# Patient Record
Sex: Male | Born: 1960 | Race: White | Hispanic: Yes | Marital: Single | Smoking: Current every day smoker
Health system: Southern US, Community
[De-identification: ages and names within clinical notes are randomized; demographics above are authoritative.]

## PROBLEM LIST (undated history)

## (undated) DIAGNOSIS — E119 Type 2 diabetes mellitus without complications: Secondary | ICD-10-CM

## (undated) HISTORY — PX: TOE AMPUTATION: SHX809

---

## 2008-03-28 ENCOUNTER — Inpatient Hospital Stay: Payer: Self-pay | Admitting: Surgery

## 2021-04-16 ENCOUNTER — Emergency Department: Payer: Self-pay

## 2021-04-16 ENCOUNTER — Encounter: Payer: Self-pay | Admitting: Emergency Medicine

## 2021-04-16 ENCOUNTER — Other Ambulatory Visit: Payer: Self-pay

## 2021-04-16 ENCOUNTER — Observation Stay: Payer: Self-pay | Admitting: Certified Registered"

## 2021-04-16 ENCOUNTER — Observation Stay
Admission: EM | Admit: 2021-04-16 | Discharge: 2021-04-18 | Disposition: A | Discharge status: Home / Self Care | Payer: Self-pay | Attending: Family Medicine | Admitting: Family Medicine

## 2021-04-16 ENCOUNTER — Observation Stay: Payer: Self-pay

## 2021-04-16 ENCOUNTER — Encounter
Admission: EM | Disposition: A | Discharge status: Home / Self Care | Payer: Self-pay | Source: Home / Self Care | Attending: Emergency Medicine

## 2021-04-16 DIAGNOSIS — L02519 Cutaneous abscess of unspecified hand: Secondary | ICD-10-CM | POA: Diagnosis present

## 2021-04-16 DIAGNOSIS — F17210 Nicotine dependence, cigarettes, uncomplicated: Secondary | ICD-10-CM | POA: Insufficient documentation

## 2021-04-16 DIAGNOSIS — Z419 Encounter for procedure for purposes other than remedying health state, unspecified: Secondary | ICD-10-CM

## 2021-04-16 DIAGNOSIS — L03119 Cellulitis of unspecified part of limb: Secondary | ICD-10-CM | POA: Diagnosis present

## 2021-04-16 DIAGNOSIS — D72829 Elevated white blood cell count, unspecified: Secondary | ICD-10-CM | POA: Insufficient documentation

## 2021-04-16 DIAGNOSIS — R739 Hyperglycemia, unspecified: Secondary | ICD-10-CM | POA: Diagnosis present

## 2021-04-16 DIAGNOSIS — Z20822 Contact with and (suspected) exposure to covid-19: Secondary | ICD-10-CM | POA: Insufficient documentation

## 2021-04-16 DIAGNOSIS — L03114 Cellulitis of left upper limb: Principal | ICD-10-CM | POA: Insufficient documentation

## 2021-04-16 DIAGNOSIS — E1165 Type 2 diabetes mellitus with hyperglycemia: Secondary | ICD-10-CM | POA: Insufficient documentation

## 2021-04-16 DIAGNOSIS — Z23 Encounter for immunization: Secondary | ICD-10-CM | POA: Insufficient documentation

## 2021-04-16 HISTORY — DX: Type 2 diabetes mellitus without complications: E11.9

## 2021-04-16 HISTORY — PX: I & D EXTREMITY: SHX5045

## 2021-04-16 LAB — CBC WITH DIFFERENTIAL/PLATELET
Abs Immature Granulocytes: 0.14 10*3/uL — ABNORMAL HIGH (ref 0.00–0.07)
Basophils Absolute: 0.1 10*3/uL (ref 0.0–0.1)
Basophils Relative: 0 %
Eosinophils Absolute: 0 10*3/uL (ref 0.0–0.5)
Eosinophils Relative: 0 %
HCT: 40.7 % (ref 39.0–52.0)
Hemoglobin: 14 g/dL (ref 13.0–17.0)
Immature Granulocytes: 1 %
Lymphocytes Relative: 4 %
Lymphs Abs: 0.9 10*3/uL (ref 0.7–4.0)
MCH: 31.5 pg (ref 26.0–34.0)
MCHC: 34.4 g/dL (ref 30.0–36.0)
MCV: 91.5 fL (ref 80.0–100.0)
Monocytes Absolute: 1.2 10*3/uL — ABNORMAL HIGH (ref 0.1–1.0)
Monocytes Relative: 5 %
Neutro Abs: 20.5 10*3/uL — ABNORMAL HIGH (ref 1.7–7.7)
Neutrophils Relative %: 90 %
Platelets: 428 10*3/uL — ABNORMAL HIGH (ref 150–400)
RBC: 4.45 MIL/uL (ref 4.22–5.81)
RDW: 11.8 % (ref 11.5–15.5)
WBC: 22.8 10*3/uL — ABNORMAL HIGH (ref 4.0–10.5)
nRBC: 0 % (ref 0.0–0.2)

## 2021-04-16 LAB — RESP PANEL BY RT-PCR (FLU A&B, COVID) ARPGX2
Influenza A by PCR: NEGATIVE
Influenza B by PCR: NEGATIVE
SARS Coronavirus 2 by RT PCR: NEGATIVE

## 2021-04-16 LAB — LACTIC ACID, PLASMA
Lactic Acid, Venous: 1.1 mmol/L (ref 0.5–1.9)
Lactic Acid, Venous: 1.3 mmol/L (ref 0.5–1.9)

## 2021-04-16 LAB — BASIC METABOLIC PANEL WITH GFR
Anion gap: 10 (ref 5–15)
BUN: 12 mg/dL (ref 8–23)
CO2: 26 mmol/L (ref 22–32)
Calcium: 8.7 mg/dL — ABNORMAL LOW (ref 8.9–10.3)
Chloride: 97 mmol/L — ABNORMAL LOW (ref 98–111)
Creatinine, Ser: 0.78 mg/dL (ref 0.61–1.24)
GFR, Estimated: >60 mL/min
Glucose, Bld: 430 mg/dL — ABNORMAL HIGH (ref 70–99)
Potassium: 4 mmol/L (ref 3.5–5.1)
Sodium: 133 mmol/L — ABNORMAL LOW (ref 135–145)

## 2021-04-16 LAB — CBG MONITORING, ED
Glucose-Capillary: 305 mg/dL — ABNORMAL HIGH (ref 70–99)
Glucose-Capillary: 251 mg/dL — ABNORMAL HIGH (ref 70–99)
Glucose-Capillary: 196 mg/dL — ABNORMAL HIGH (ref 70–99)

## 2021-04-16 LAB — MRSA NEXT GEN BY PCR, NASAL: MRSA by PCR Next Gen: NOT DETECTED

## 2021-04-16 SURGERY — IRRIGATION AND DEBRIDEMENT EXTREMITY
Anesthesia: Monitor Anesthesia Care | Laterality: Left

## 2021-04-16 MED ORDER — FENTANYL CITRATE PF 50 MCG/ML IJ SOSY: 25.0000 ug | PREFILLED_SYRINGE | INTRAMUSCULAR | Status: DC | PRN

## 2021-04-16 MED ORDER — VANCOMYCIN HCL 1500 MG/300ML IV SOLN
1500.0000 mg | Freq: Once | INTRAVENOUS | Status: AC
  Administered 2021-04-16: 1500 mg via INTRAVENOUS
  Filled 2021-04-16: qty 300

## 2021-04-16 MED ORDER — ONDANSETRON HCL 4 MG/2ML IJ SOLN: 4.0000 mg | Freq: Once | INTRAMUSCULAR | Status: DC | PRN

## 2021-04-16 MED ORDER — OXYCODONE HCL 5 MG PO TABS
5.0000 mg | ORAL_TABLET | Freq: Once | ORAL | Status: AC
  Administered 2021-04-16: 5 mg via ORAL
  Filled 2021-04-16: qty 1

## 2021-04-16 MED ORDER — HYDROCODONE-ACETAMINOPHEN 5-325 MG PO TABS
1.0000 | ORAL_TABLET | ORAL | Status: DC | PRN
  Administered 2021-04-17 (×2): 1 via ORAL
  Filled 2021-04-16 (×2): qty 1

## 2021-04-16 MED ORDER — INSULIN ASPART 100 UNIT/ML IJ SOLN
0.0000 [IU] | Freq: Three times a day (TID) | INTRAMUSCULAR | Status: DC
  Administered 2021-04-17: 3 [IU] via SUBCUTANEOUS
  Administered 2021-04-17: 5 [IU] via SUBCUTANEOUS
  Administered 2021-04-17 – 2021-04-18 (×3): 8 [IU] via SUBCUTANEOUS
  Filled 2021-04-16 (×5): qty 1

## 2021-04-16 MED ORDER — POLYETHYLENE GLYCOL 3350 17 G PO PACK: 17.0000 g | PACK | Freq: Every day | ORAL | Status: DC | PRN

## 2021-04-16 MED ORDER — SODIUM CHLORIDE 0.9 % IV BOLUS
1000.0000 mL | Freq: Once | INTRAVENOUS | Status: AC
Start: 2021-04-16 — End: 2021-04-16
  Administered 2021-04-16: 1000 mL via INTRAVENOUS

## 2021-04-16 MED ORDER — LIDOCAINE HCL (PF) 2 % IJ SOLN
INTRAMUSCULAR | Status: DC | PRN
  Administered 2021-04-16: 200 mg via PERINEURAL

## 2021-04-16 MED ORDER — SODIUM CHLORIDE 0.9 % IV BOLUS
1000.0000 mL | Freq: Once | INTRAVENOUS | Status: AC
  Administered 2021-04-16: 1000 mL via INTRAVENOUS

## 2021-04-16 MED ORDER — MIDAZOLAM HCL 2 MG/2ML IJ SOLN
1.0000 mg | Freq: Once | INTRAMUSCULAR | Status: AC
Start: 2021-04-16 — End: 2021-04-16

## 2021-04-16 MED ORDER — CEFTRIAXONE SODIUM 1 G IJ SOLR
1.0000 g | Freq: Once | INTRAMUSCULAR | Status: AC
Start: 2021-04-16 — End: 2021-04-16
  Administered 2021-04-16: 1 g via INTRAVENOUS
  Filled 2021-04-16: qty 10

## 2021-04-16 MED ORDER — VANCOMYCIN HCL 750 MG/150ML IV SOLN
750.0000 mg | Freq: Two times a day (BID) | INTRAVENOUS | Status: DC
  Administered 2021-04-17 – 2021-04-18 (×3): 750 mg via INTRAVENOUS
  Filled 2021-04-16 (×3): qty 150

## 2021-04-16 MED ORDER — INSULIN ASPART 100 UNIT/ML IJ SOLN
0.0000 [IU] | Freq: Every day | INTRAMUSCULAR | Status: DC
  Administered 2021-04-17: 3 [IU] via SUBCUTANEOUS
  Filled 2021-04-16: qty 1

## 2021-04-16 MED ORDER — FENTANYL CITRATE (PF) 100 MCG/2ML IJ SOLN: 25.0000 ug | INTRAMUSCULAR | Status: DC | PRN

## 2021-04-16 MED ORDER — OXYCODONE HCL 5 MG PO TABS: 5.0000 mg | ORAL_TABLET | Freq: Once | ORAL | Status: DC | PRN

## 2021-04-16 MED ORDER — SODIUM CHLORIDE 0.9 % IV SOLN: 2.0000 g | INTRAVENOUS | Status: DC

## 2021-04-16 MED ORDER — LACTATED RINGERS IV SOLN: INTRAVENOUS | Status: DC

## 2021-04-16 MED ORDER — OXYCODONE HCL 5 MG/5ML PO SOLN
5.0000 mg | Freq: Once | ORAL | Status: DC | PRN
  Filled 2021-04-16: qty 5

## 2021-04-16 MED ORDER — ENOXAPARIN SODIUM 40 MG/0.4ML IJ SOSY
40.0000 mg | PREFILLED_SYRINGE | INTRAMUSCULAR | Status: DC
  Administered 2021-04-17: 40 mg via SUBCUTANEOUS
  Filled 2021-04-16: qty 0.4

## 2021-04-16 MED ORDER — LIDOCAINE HCL (CARDIAC) PF 100 MG/5ML IV SOSY
PREFILLED_SYRINGE | INTRAVENOUS | Status: DC | PRN
  Administered 2021-04-16: 60 mg via INTRAVENOUS

## 2021-04-16 MED ORDER — FENTANYL CITRATE (PF) 100 MCG/2ML IJ SOLN
INTRAMUSCULAR | Status: AC
  Administered 2021-04-16: 50 ug
  Filled 2021-04-16: qty 2

## 2021-04-16 MED ORDER — ACETAMINOPHEN 325 MG PO TABS
650.0000 mg | ORAL_TABLET | Freq: Four times a day (QID) | ORAL | Status: DC | PRN
  Administered 2021-04-17: 650 mg via ORAL
  Filled 2021-04-16: qty 2

## 2021-04-16 MED ORDER — TETANUS-DIPHTH-ACELL PERTUSSIS 5-2.5-18.5 LF-MCG/0.5 IM SUSY
0.5000 mL | PREFILLED_SYRINGE | Freq: Once | INTRAMUSCULAR | Status: AC
  Administered 2021-04-16: 0.5 mL via INTRAMUSCULAR
  Filled 2021-04-16: qty 0.5

## 2021-04-16 MED ORDER — FENTANYL CITRATE (PF) 100 MCG/2ML IJ SOLN
INTRAMUSCULAR | Status: DC | PRN
  Administered 2021-04-16 (×2): 50 ug via INTRAVENOUS

## 2021-04-16 MED ORDER — PROPOFOL 500 MG/50ML IV EMUL
INTRAVENOUS | Status: DC | PRN
  Administered 2021-04-16: 25 ug/kg/min via INTRAVENOUS

## 2021-04-16 MED ORDER — MIDAZOLAM HCL 2 MG/2ML IJ SOLN
INTRAMUSCULAR | Status: DC | PRN
  Administered 2021-04-16 (×2): 1 mg via INTRAVENOUS

## 2021-04-16 MED ORDER — IOHEXOL 300 MG/ML  SOLN
75.0000 mL | Freq: Once | INTRAMUSCULAR | Status: AC | PRN
  Administered 2021-04-16: 75 mL via INTRAVENOUS
  Filled 2021-04-16: qty 75

## 2021-04-16 MED ORDER — CEFAZOLIN SODIUM-DEXTROSE 2-3 GM-%(50ML) IV SOLR
INTRAVENOUS | Status: DC | PRN
  Administered 2021-04-16: 2 g via INTRAVENOUS

## 2021-04-16 MED ORDER — ACETAMINOPHEN 10 MG/ML IV SOLN: 1000.0000 mg | Freq: Once | INTRAVENOUS | Status: DC | PRN

## 2021-04-16 MED ORDER — LACTATED RINGERS IV SOLN: INTRAVENOUS | Status: AC

## 2021-04-16 MED ORDER — FENTANYL CITRATE PF 50 MCG/ML IJ SOSY: 50.0000 ug | PREFILLED_SYRINGE | Freq: Once | INTRAMUSCULAR | Status: DC

## 2021-04-16 MED ORDER — MORPHINE SULFATE (PF) 2 MG/ML IV SOLN: 2.0000 mg | INTRAVENOUS | Status: DC | PRN

## 2021-04-16 MED ORDER — BUPIVACAINE HCL (PF) 0.5 % IJ SOLN
INTRAMUSCULAR | Status: AC
  Filled 2021-04-16: qty 10

## 2021-04-16 MED ORDER — SODIUM CHLORIDE 0.9 % IV SOLN: 1.0000 g | Freq: Once | INTRAVENOUS | Status: DC

## 2021-04-16 MED ORDER — ACETAMINOPHEN 650 MG RE SUPP: 650.0000 mg | Freq: Four times a day (QID) | RECTAL | Status: DC | PRN

## 2021-04-16 MED ORDER — SODIUM CHLORIDE 0.9 % IV SOLN
2.0000 g | Freq: Three times a day (TID) | INTRAVENOUS | Status: DC
  Administered 2021-04-17 – 2021-04-18 (×4): 2 g via INTRAVENOUS
  Filled 2021-04-16 (×7): qty 2

## 2021-04-16 MED ORDER — BUPIVACAINE HCL (PF) 0.5 % IJ SOLN
INTRAMUSCULAR | Status: DC | PRN
  Administered 2021-04-16: 10 mL via PERINEURAL

## 2021-04-16 MED ORDER — OXYCODONE HCL 5 MG/5ML PO SOLN: 5.0000 mg | Freq: Once | ORAL | Status: DC | PRN

## 2021-04-16 MED ORDER — MIDAZOLAM HCL 2 MG/2ML IJ SOLN
INTRAMUSCULAR | Status: AC
  Administered 2021-04-16: 1 mg via INTRAVENOUS
  Filled 2021-04-16: qty 2

## 2021-04-16 MED ORDER — ACETAMINOPHEN 10 MG/ML IV SOLN
1000.0000 mg | Freq: Once | INTRAVENOUS | Status: DC | PRN
  Filled 2021-04-16: qty 100

## 2021-04-16 SURGICAL SUPPLY — 26 items
BNDG COHESIVE 4X5 TAN ST LF (GAUZE/BANDAGES/DRESSINGS) ×3 IMPLANT
BNDG CONFORM 2 STRL LF (GAUZE/BANDAGES/DRESSINGS) IMPLANT
BNDG ESMARK 4X12 TAN STRL LF (GAUZE/BANDAGES/DRESSINGS) ×3 IMPLANT
CHLORAPREP W/TINT 26 (MISCELLANEOUS) ×3 IMPLANT
CORD BIP STRL DISP 12FT (MISCELLANEOUS) IMPLANT
CUFF TOURN SGL QUICK 18X4 (TOURNIQUET CUFF) IMPLANT
DRAIN PENROSE 12X.25 LTX STRL (MISCELLANEOUS) ×2 IMPLANT
ELECT REM PT RETURN 9FT ADLT (ELECTROSURGICAL) ×3
ELECTRODE REM PT RTRN 9FT ADLT (ELECTROSURGICAL) ×1 IMPLANT
GAUZE PACKING IODOFORM 1X5 (PACKING) ×2 IMPLANT
GAUZE SPONGE 4X4 12PLY STRL (GAUZE/BANDAGES/DRESSINGS) ×3 IMPLANT
GAUZE XEROFORM 1X8 LF (GAUZE/BANDAGES/DRESSINGS) ×3 IMPLANT
GLOVE SURG ENC MOIS LTX SZ8 (GLOVE) ×6 IMPLANT
GLOVE SURG UNDER LTX SZ8 (GLOVE) ×3 IMPLANT
GOWN STRL REUS W/ TWL LRG LVL3 (GOWN DISPOSABLE) ×1 IMPLANT
GOWN STRL REUS W/ TWL XL LVL3 (GOWN DISPOSABLE) ×1 IMPLANT
GOWN STRL REUS W/TWL LRG LVL3 (GOWN DISPOSABLE) ×2
GOWN STRL REUS W/TWL XL LVL3 (GOWN DISPOSABLE) ×2
KIT TURNOVER KIT A (KITS) ×3 IMPLANT
MANIFOLD NEPTUNE II (INSTRUMENTS) ×3 IMPLANT
PACK EXTREMITY ARMC (MISCELLANEOUS) ×3 IMPLANT
STOCKINETTE IMPERVIOUS LG (DRAPES) ×3 IMPLANT
STRAP SAFETY 5IN WIDE (MISCELLANEOUS) ×3 IMPLANT
SUT VIC AB 3-0 SH 27 (SUTURE)
SUT VIC AB 3-0 SH 27X BRD (SUTURE) IMPLANT
WATER STERILE IRR 500ML POUR (IV SOLUTION) ×3 IMPLANT

## 2021-04-16 NOTE — H&P (Addendum)
[facility]  History and Physical   Benjamin Brandt ENI:[id number] DOB: Nov 22, 1960 DOA: 04/16/2021  PCP: Pcp, No  Patient coming from: Home  I have personally briefly reviewed patient's old medical records in [place].  Chief Concern: Left hand wound  HPI: Mr. Benjamin Brandt is a 61 with medical history of non-insulin-dependent diabetes mellitus, currently on p.o. antiglycemic agents, presents emergency department for chief concerns of left hand wound.  Vitals in the emergency department showed temperature of 98.7, respiration rate of 16, heart rate 95, blood pressure 132/75, SPO2 of 97% on room air.  Serum sodium 133, potassium 4.0, chloride of 97, bicarb 26, BUN of 12, serum creatinine of 0.78, GFR greater than 60, nonfasting blood glucose 430, WBC elevated at 22.8, hemoglobin 14, platelets of 428.  Lactic acid was 1.1 on first check and 1.3 on repeat check.  MRSA PCR/COVID/influenza A/influenza B PCR were negative.  Blood cultures x2 have been collected and are pending from the ED.  ED treatment: ceftriaxone 1 g IV, sodium chloride 2 L bolus, and was started on lactated ringer 125 mL/h, for 3 hours.  At bedside, he is able to tell me his name, age, current location of hospital. He denies fever, nausea, vomiting, chest pain, shortness of breath. He denies diarrhea, dysuria.   He presents for wounds on his left hand that started on Monday, 04/11/21. 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 and he started having wounds on Monday   He states that he is currently on an oral antiglycemic agents, met something however he was not able to tell me the correct name.  Social history: He smokes 1 - 1.5 ppd. He denies etoh and recreational drug use.   ROS: Constitutional:  no fever Cardiovascular: no chest pain, no dyspnea,  + edema, no palpitations Respiratory: no cough, no sputum, no wheezing Gastrointestinal: no nausea, no vomiting, no diarrhea, no  constipation Genitourinary: no urinary incontinence, no dysuria, no hematuria Musculoskeletal: no arthralgias, no myalgias Skin: + skin lesions, no pruritus, negative for bleeding Neuro: no weakness, no loss of consciousness, no syncope Psych: no anxiety, no depression, + decrease appetite Heme/Lymph: no bruising, no bleeding  ED Course: Discussed with emergency medicine provider, patient requiring hospitalization for chief concerns of left hand cellulitis with abscess.  Assessment/Plan  Principal Problem:   Hand abscess Active Problems:   Hyperglycemia   Leukocytosis   Cellulitis and abscess of hand   * Hand abscess Cellulitis Assessment & Plan - Secondary to puncture wound approximately 8 days ago - Ortho has been consulted and patient will be taken to the OR for I&D on day of admission - Keep n.p.o. at this time except for sips with meds - Status post sodium chloride 2 L bolus in the ED - Continue LR at 125 mL/h, 10 hours ordered - Pain medication including acetaminophen for mild pain, fever; Percocet for moderate pain; and morphine 2 g IV every 4 hours as needed for severe pain, 1 day ordered  Leukocytosis Assessment & Plan - Presumed secondary to left hand abscess secondary to puncture wound - Ceftriaxone 1 g IV per EDP, ordered additional 1 g to total 2 g on admission given patient's severe hyperglycemia and suspected undiagnosed diabetes mellitus - Ceftriaxone 2 g continued for 04/17/2020 - Blood cultures x2 have been collected though low clinical suspicion for sepsis at this time as lactic acid has remained negative x2  Hyperglycemia Assessment & Plan - Patient is currently not on  any diabetic medication - Insulin SSI with at bedtime coverage ordered - A1c is in process - Goal inpatient blood glucose level is 140-180 in order to promote appropriate wound healing  Addendum: Received message from surgeon noting that there was a significant amount of pus divided from  patient's hand.  He recommends broader spectrum antibiotic. - Antibiotic has been changed from ceftriaxone per cellulitis with abscess order set to vancomycin and cefepime per pharmacy  Chart reviewed.   DVT prophylaxis: Heparin starting on 04/17/2021 in the evening Code Status: Full code Diet: N.p.o. except for sips with Family Communication: No, patient called and updated his family in Spanish Disposition Plan: Pending clinical course Consults called: Orthopedic Admission status:   Past Medical History:  Diagnosis Date   Diabetes mellitus without complication ([place])    Past Surgical History:  Procedure Laterality Date   TOE AMPUTATION Right    Social History:  reports that he has been smoking cigarettes. He has been smoking an average of 1 pack per day. He has never used smokeless tobacco. He reports that he does not currently use alcohol. He reports that he does not use drugs.  Not on File History reviewed. No pertinent family history. Family history: Family history reviewed and not pertinent  Prior to Admission medications   Not on File   Physical Exam: Vitals:   04/16/21 1630 04/16/21 1700 04/16/21 1730 04/16/21 1800  BP: 127/66 127/72 129/68 125/68  Pulse: 82 87 82 84  Resp: _0 Temp:      TempSrc:      SpO2: 95% 96% 94% 96%  Weight:      Height:       Constitutional: appears age-appropriate, NAD, calm, comfortable Eyes: PERRL, lids and conjunctivae normal ENMT: Mucous membranes are moist. Posterior pharynx clear of any exudate or lesions. Age-appropriate dentition. Hearing appropriate Neck: normal, supple, no masses, no thyromegaly Respiratory: clear to auscultation bilaterally, no wheezing, no crackles. Normal respiratory effort. No accessory muscle use.  Cardiovascular: Regular rate and rhythm, no murmurs / rubs / gallops. No extremity edema. 2+ pedal pulses. No carotid bruits.  Abdomen: no tenderness, no masses palpated, no hepatosplenomegaly. Bowel  sounds positive.  Musculoskeletal: no clubbing / cyanosis. No joint deformity upper and lower extremities. Good ROM, no contractures, no atrophy. Normal muscle tone.  Skin: Wound with abscess with erythema and warmth      Neurologic: Sensation intact. Strength 5/5 in all 4.  Psychiatric: Normal judgment and insight. Alert and oriented x 3. Normal mood.   EKG: Not indicated at this time  x-ray on Admission: I personally reviewed and I agree with radiologist reading as below.  CT HAND LEFT W CONTRAST  Result Date: 04/16/2021 CLINICAL DATA:  Soft tissue infection EXAM: CT OF THE UPPER LEFT EXTREMITY WITH CONTRAST TECHNIQUE: Multidetector CT imaging of the left hand was performed according to the standard protocol following intravenous contrast administration. RADIATION DOSE REDUCTION: This exam was performed according to the departmental dose-optimization program which includes automated exposure control, adjustment of the mA and/or kV according to patient size and/or use of iterative reconstruction technique. CONTRAST:  35m OMNIPAQUE IOHEXOL 300 MG/ML  SOLN COMPARISON:  Radiographs 04/16/2021 FINDINGS: Please note, I did not have the technologist reperformed all axial, sagittal, and coronal MPR is to match protocol because today's exam is primarily to assess for infection rather than to assess for fracture or specific bony abnormality. Bones/Joint/Cartilage Limited assessment due to imaging planes. No bony destructive findings characteristic of  osteomyelitis. Degenerative subcortical cyst formation in the proximal lunate. Questionable lucent endosteal scalloping anteriorly in the distal radius, significance uncertain. Ligaments Suboptimally assessed by CT. Muscles and Tendons No intramuscular abscess identified. However, there is a 2.6 by 1.3 by 4.4 cm (volume = 7.8 cm^3) fluid collection tracking along the superficial fascia margin of the abductor digiti minimi muscle for example on image 68 series  3. There is no gas within this collection, and no substantial enhancement along the margins of this fluid collection. Soft tissues Diffuse subcutaneous edema in the distal forearm, wrist, and tracking into the dorsum of the hand. No gas tracks in the soft tissues. Arterial atherosclerosis noted. Do not see a foreign body. IMPRESSION: 1. Diffuse edema in the distal forearm, wrist, tracking in the dorsum of the hand towards the fingers. No findings of osteomyelitis, foreign body, or gas in the soft tissues. 2. Proximally 8 cc fluid collection tracks along the superficial fascial margin of the abductor digiti minimi muscle. There is no substantial enhancement of the margins of this collection to indicate that it is an abscess in this may simply represent a reactive but noninfected fluid collection. Incipient abscess cannot be completely excluded. Electronically Signed   By: Van Clines M.D.   On: 04/16/2021 10:24   DG Hand 2 View Left  Result Date: 04/16/2021 CLINICAL DATA:  61 year old male with history of swelling. Evaluate for foreign body. EXAM: LEFT HAND - 2 VIEW COMPARISON:  No priors. FINDINGS: There is no evidence of fracture or dislocation. There is no evidence of arthropathy or other focal bone abnormality. Numerous vascular calcifications are noted. Soft tissues are otherwise unremarkable. Specifically, no radiopaque foreign body identified. IMPRESSION: 1. No unexpected radiopaque foreign body in the soft tissues. 2. No acute bony abnormality. 3. Atherosclerosis. Electronically Signed   By: Vinnie Langton M.D.   On: 04/16/2021 07:59   [place] OR NERVE BLOCK-IMAGE ONLY [facility])  Result Date: 04/16/2021 There is no interpretation for this exam.  This order is for images obtained during a surgical procedure.  Please See "Surgeries" Tab for more information regarding the procedure.    Labs on Admission: I have personally reviewed following labs  CBC: Recent Labs  Lab 04/16/21 0742  WBC 22.8*   NEUTROABS 20.5*  HGB 14.0  HCT 40.7  MCV 91.5  PLT 149*   Basic Metabolic Panel: Recent Labs  Lab 04/16/21 0742  NA 133*  K 4.0  CL 97*  CO2 26  GLUCOSE 430*  BUN 12  CREATININE 0.78  CALCIUM 8.7*   GFR: Estimated Creatinine Clearance: 83.3 mL/min (by C-G formula based on SCr of 0.78 mg/dL).  CBG: Recent Labs  Lab 04/16/21 1115 04/16/21 1315  GLUCAP 305* 251*   Dr. Tobie Poet [facility]  If 7PM-7AM, please contact overnight-coverage provider If 7AM-7PM, please contact day coverage provider [email or web address]  04/16/2021, 7:34 PM

## 2021-04-16 NOTE — Assessment & Plan Note (Signed)
-   Presumed secondary to left hand abscess secondary to puncture wound ?- Ceftriaxone 1 g IV per EDP, ordered additional 1 g to total 2 g on admission given patient's severe hyperglycemia and suspected undiagnosed diabetes mellitus ?- Ceftriaxone 2 g continued for 04/17/2020 ?- Blood cultures x2 have been collected though low clinical suspicion for sepsis at this time as lactic acid has remained negative x2 ?

## 2021-04-16 NOTE — Hospital Course (Addendum)
Mr. Mung Rinker is a 18 with medical history of non-insulin-dependent diabetes mellitus, currently on p.o. antiglycemic agents, presents emergency department for chief concerns of left hand wound. ? ?Vitals in the emergency department showed temperature of 98.7, respiration rate of 16, heart rate 95, blood pressure 132/75, SPO2 of 97% on room air. ? ?Serum sodium 133, potassium 4.0, chloride of 97, bicarb 26, BUN of 12, serum creatinine of 0.78, GFR greater than 60, nonfasting blood glucose 430, WBC elevated at 22.8, hemoglobin 14, platelets of 428. ? ?Lactic acid was 1.1 on first check and 1.3 on repeat check. ? ?MRSA PCR/COVID/influenza A/influenza B PCR were negative. ? ?Blood cultures x2 have been collected and are pending from the ED. ? ?ED treatment: ceftriaxone 1 g IV, sodium chloride 2 L bolus, and was started on lactated ringer 125 mL/h, for 3 hours. ?

## 2021-04-16 NOTE — Assessment & Plan Note (Addendum)
-   Secondary to puncture wound approximately 8 days ago ?- Ortho has been consulted and patient will be taken to the OR for I&D on day of admission ?- Keep n.p.o. at this time except for sips with meds ?- Status post sodium chloride 2 L bolus in the ED ?- Continue LR at 125 mL/h, 10 hours ordered ?- Pain medication including acetaminophen for mild pain, fever; Percocet for moderate pain; and morphine 2 g IV every 4 hours as needed for severe pain, 1 day ordered ?

## 2021-04-16 NOTE — ED Triage Notes (Signed)
Per interpreter, Pt reports was working in the yard last Friday and felt something sharp in his left hand. Pt reports thinks he was bit by a spider or something because his left hand has been painful and swollen since then.  ?

## 2021-04-16 NOTE — Anesthesia Procedure Notes (Signed)
Anesthesia Regional Block: Supraclavicular block  ? ?Pre-Anesthetic Checklist: , timeout performed,  Correct Patient, Correct Site, Correct Laterality,  Correct Procedure, Correct Position, site marked,  Risks and benefits discussed,  Surgical consent,  Pre-op evaluation,  At surgeon's request and post-op pain management ? ?Laterality: Left ? ?Prep: chloraprep     ?  ?Needles:  ?Injection technique: Single-shot ? ?Needle Type: Echogenic Needle   ? ? ?Needle Length: 4cm  ?Needle Gauge: 25  ? ? ? ?Additional Needles: ? ? ?Procedures:,,,, ultrasound used (permanent image in chart),,    ?Narrative:  ?Injection made incrementally with aspirations every 5 mL. ? ?Performed by: Personally  ?Anesthesiologist: Corinda Gubler, MD ? ?Additional Notes: ?Patient's chart reviewed and they were deemed appropriate candidate for procedure, per surgeon's request. Patient educated about risks, benefits, and alternatives of the block including but not limited to: temporary or permanent nerve damage, hemidiaphragmatic paralysis leading to dyspnea, pneumothorax, bleeding, infection, damage to surround tissues, block failure, local anesthetic toxicity. Patient expressed understanding. A formal time-out was conducted consistent with institution rules. ? ?Monitors were applied, and minimal sedation used (see nursing record). The site was prepped with skin prep and allowed to dry, and sterile gloves were used. A high frequency linear ultrasound probe with probe cover was utilized throughout. Supraclavicular artery visualized, along with the brachial plexus adjacent to it and appeared anatomically normal. Local anesthetic injected around the plexus, and echogenic block needle trajectory was monitored throughout. Aspiration performed every 72ml. Lung and blood vessels were avoided. All injections were performed without resistance and free of blood and paresthesias. The patient tolerated the procedure well. ? ?Injectate: 62ml 0.5% bupivacaine + 7ml  2% lidocaine. ? ? ? ? ?

## 2021-04-16 NOTE — ED Notes (Signed)
Surgeon in room with pt, online Spanish interpreter used. ?

## 2021-04-16 NOTE — Consult Note (Signed)
ORTHOPAEDIC CONSULTATION ? ?PATIENT NAME: Benjamin Brandt ?DOB: May 05, 1960  ?MRN: [id number] ? ?REQUESTING PHYSICIAN: Gilles Chiquito, MD ? ?Chief Complaint: Left hand infection ? ?HPI: ?Benjamin Brandt is a 61 y.o. male who complains of  a left hand infection that started Monday. Hx and PE were performed with video interpreter as patient is spanish speaking only. Pt had injury to the hand last Friday on a plant, on Monday it looked infected. It never drained pus, just serous fluid. It hurts moderately. No numbness/tingling.  ? ?Past Medical History:  ?Diagnosis Date  ? Diabetes mellitus without complication ([facility])   ? ?History reviewed. No pertinent surgical history. ?Social History  ? ?Socioeconomic History  ? Marital status: Single  ?  Spouse name: Not on file  ? Number of children: Not on file  ? Years of education: Not on file  ? Highest education level: Not on file  ?Occupational History  ? Not on file  ?Tobacco Use  ? Smoking status: Every Day  ?  Packs/day: 1.00  ?  Types: Cigarettes  ? Smokeless tobacco: Never  ?Substance and Sexual Activity  ? Alcohol use: Not on file  ? Drug use: Not on file  ? Sexual activity: Not on file  ?Other Topics Concern  ? Not on file  ?Social History Narrative  ? Not on file  ? ?Social Determinants of Health  ? ?Financial Resource Strain: Not on file  ?Food Insecurity: Not on file  ?Transportation Needs: Not on file  ?Physical Activity: Not on file  ?Stress: Not on file  ?Social Connections: Not on file  ? ?History reviewed. No pertinent family history. ?Not on File ?Prior to Admission medications   ?Not on File  ? ?CT HAND LEFT W CONTRAST ? ?Result Date: 04/16/2021 ?CLINICAL DATA:  Soft tissue infection EXAM: CT OF THE UPPER LEFT EXTREMITY WITH CONTRAST TECHNIQUE: Multidetector CT imaging of the left hand was performed according to the standard protocol following intravenous contrast administration. RADIATION DOSE REDUCTION: This exam was performed according to  the departmental dose-optimization program which includes automated exposure control, adjustment of the mA and/or kV according to patient size and/or use of iterative reconstruction technique. CONTRAST:  63mL OMNIPAQUE IOHEXOL 300 MG/ML  SOLN COMPARISON:  Radiographs 04/16/2021 FINDINGS: Please note, I did not have the technologist reperformed all axial, sagittal, and coronal MPR is to match protocol because today's exam is primarily to assess for infection rather than to assess for fracture or specific bony abnormality. Bones/Joint/Cartilage Limited assessment due to imaging planes. No bony destructive findings characteristic of osteomyelitis. Degenerative subcortical cyst formation in the proximal lunate. Questionable lucent endosteal scalloping anteriorly in the distal radius, significance uncertain. Ligaments Suboptimally assessed by CT. Muscles and Tendons No intramuscular abscess identified. However, there is a 2.6 by 1.3 by 4.4 cm (volume = 7.8 cm^3) fluid collection tracking along the superficial fascia margin of the abductor digiti minimi muscle for example on image 68 series 3. There is no gas within this collection, and no substantial enhancement along the margins of this fluid collection. Soft tissues Diffuse subcutaneous edema in the distal forearm, wrist, and tracking into the dorsum of the hand. No gas tracks in the soft tissues. Arterial atherosclerosis noted. Do not see a foreign body. IMPRESSION: 1. Diffuse edema in the distal forearm, wrist, tracking in the dorsum of the hand towards the fingers. No findings of osteomyelitis, foreign body, or gas in the soft tissues. 2. Proximally 8 cc fluid collection tracks along  the superficial fascial margin of the abductor digiti minimi muscle. There is no substantial enhancement of the margins of this collection to indicate that it is an abscess in this may simply represent a reactive but noninfected fluid collection. Incipient abscess cannot be completely  excluded. Electronically Signed   By: Gaylyn Rong M.D.   On: 04/16/2021 10:24  ? ?DG Hand 2 View Left ? ?Result Date: 04/16/2021 ?CLINICAL DATA:  61 year old male with history of swelling. Evaluate for foreign body. EXAM: LEFT HAND - 2 VIEW COMPARISON:  No priors. FINDINGS: There is no evidence of fracture or dislocation. There is no evidence of arthropathy or other focal bone abnormality. Numerous vascular calcifications are noted. Soft tissues are otherwise unremarkable. Specifically, no radiopaque foreign body identified. IMPRESSION: 1. No unexpected radiopaque foreign body in the soft tissues. 2. No acute bony abnormality. 3. Atherosclerosis. Electronically Signed   By: Trudie Reed M.D.   On: 04/16/2021 07:59   ? ?Positive ROS: All other systems have been reviewed and were otherwise negative with the exception of those mentioned in the HPI and as above. ? ?Physical Exam: ? ?MUSCULOSKELETAL: Left Hand- Eschar over large portion of hypothenar eminence with draining purulence with any pressure. There is full thickness and near full thickness skin loss in the area. Patient has painless active motion of all digits. He is intact to sensation on radial and ulnar borders of the digits.  ? ?Assessment: ?Left hand abscess ? ?Plan: ?Discussed options with the patient, and discussed the case with on call Hand surgeon at [facility]. I believe the patient would benefit from I&D and culture directed antibiotics. I discussed the possible need for coverage with the patient and the on call hand surgeon. This can be arranged as an outpatient if needed after initial I&D. Discussed the risks and benefits with the patient, along with the potential that he requires further procedures to include surgery for skin coverage, or the possibility of partial loss of his hand. The patient understands.  ? ?-Plan for admission to the hospitalist team and I&D today ? ?Karleen Hampshire, M.D.  ? ? ?

## 2021-04-16 NOTE — ED Notes (Signed)
Pt with swollen and red left hand, no drainage noted, open 3cm x 3 cm wound on left palm with necrotic center.  ?

## 2021-04-16 NOTE — ED Provider Notes (Signed)
I assumed care of this patient approximately 1500.  Please see outgoing providers note for full details regarding patient's initial evaluation and assessment.  In brief patient presents for evaluation of left-sided hand wound.  Concern for possible early developing abscess with CT showing diffuse edema in the distal forearm and wrist tracking in the dorsum of the hand towards the fingers without 8 cc fluid collection along superficial fascial margin of the abductor digiti minimus muscle.  Patient not septic but is diabetic and slightly hyperglycemic on arrival.  He was given some antibiotics and IV fluids.  Tetanus updated.  At time of signout he is pending consult from orthopedic service. ? ?Patient seen by Dr. Franco Collet at bedside after discussion with hand surgeon at [facility] and decision made to take patient to the OR for debridement tonight and admit to the hospitalist service here.  I will consult hospitalist for admission. ?  ?Gilles Chiquito, MD ?04/16/21 1810 ? ?

## 2021-04-16 NOTE — Transfer of Care (Signed)
Immediate Anesthesia Transfer of Care Note ? ?Patient: Benjamin Brandt ? ?Procedure(s) Performed: IRRIGATION AND DEBRIDEMENT HAND (Left) ? ?Patient Location: PACU ? ?Anesthesia Type:MAC combined with regional for post-op pain ? ?Level of Consciousness: drowsy and patient cooperative ? ?Airway & Oxygen Therapy: Patient Spontanous Breathing and Patient connected to nasal cannula oxygen ? ?Post-op Assessment: Report given to RN and Post -op Vital signs reviewed and stable ? ?Post vital signs: Reviewed and stable ? ?Last Vitals:  ?Vitals Value Taken Time  ?BP 109/65 04/16/21 2153  ?Temp    ?Pulse 72 04/16/21 2154  ?Resp 16 04/16/21 2154  ?SpO2 97 % 04/16/21 2154  ?Vitals shown include unvalidated device data. ? ?Last Pain:  ?Vitals:  ? 04/16/21 1948  ?TempSrc:   ?PainSc: 0-No pain  ?   ? ?  ? ?Complications: No notable events documented. ?

## 2021-04-16 NOTE — Assessment & Plan Note (Addendum)
-   Patient is currently not on any diabetic medication ?- Insulin SSI with at bedtime coverage ordered ?- A1c is in process ?- Goal inpatient blood glucose level is 140-180 in order to promote appropriate wound healing ?

## 2021-04-16 NOTE — Progress Notes (Addendum)
Pharmacy Antibiotic Note ? ?Benjamin Brandt is a 60 y.o. male admitted on 04/16/2021. Pharmacy has been consulted for vancomycin and cefepime dosing. MD noted a lot of pus draining from wound. Pt to be taken to OR for I&D.  ? ?Plan: ?Cefepime 2 g IV q8h ?Vancomycin 1500 mg IV x 1 loading dose followed by 750 mg IV q12h ?Est AUC: 446.2 ?Used: Scr 0.8 (rounded up), IBW, Vd 0.72 ?Monitor renal function and adjust dose as clinically indicated ? ?Height: 5\' 5"  (165.1 cm) ?Weight: 63.5 kg (140 lb) ?IBW/kg (Calculated) : 61.5 ? ?Temp (24hrs), Avg:98.7 ?F (37.1 ?C), Min:98.4 ?F (36.9 ?C), Max:98.9 ?F (37.2 ?C) ? ?Recent Labs  ?Lab 04/16/21 ?0742 04/16/21 ?0928  ?WBC 22.8*  --   ?CREATININE 0.78  --   ?LATICACIDVEN 1.1 1.3  ?  ?Estimated Creatinine Clearance: 83.3 mL/min (by C-G formula based on SCr of 0.78 mg/dL).   ? ?Not on File ? ?Antimicrobials this admission: ?3/11 ceftriaxone x 1 ?3/12 vancomycin >> ?3/12 cefepime >>  ? ? ?Microbiology results: ?3/11 BCx: sent ?3/11 MRSA PCR: not detected ? ?Thank you for allowing pharmacy to be a part of this patient?s care. ? ?5/11 Benjamin Brandt ?04/16/2021 10:10 PM ? ?

## 2021-04-16 NOTE — ED Notes (Signed)
Patient transported to CT 

## 2021-04-16 NOTE — ED Notes (Signed)
Bg 251 ?

## 2021-04-16 NOTE — Anesthesia Preprocedure Evaluation (Signed)
Anesthesia Evaluation  ?Patient identified by MRN, date of birth, ID band ?Patient awake ? ?General Assessment Comment: ? ?Hand/wrist cellulitis. No tracking up the arm. No prior issues with anesthesia. Normal room air spo2. ? ?Reviewed: ?Allergy & Precautions, NPO status , Patient's Chart, lab work & pertinent test results ? ?History of Anesthesia Complications ?Negative for: history of anesthetic complications ? ?Airway ?Mallampati: II ? ?TM Distance: >3 FB ?Neck ROM: Full ? ? ? Dental ? ?(+) Poor Dentition, Chipped ?  ?Pulmonary ?neg sleep apnea, neg COPD, Current Smoker and Patient abstained from smoking.,  ?  ?Pulmonary exam normal ?breath sounds clear to auscultation ? ? ? ? ? ? Cardiovascular ?Exercise Tolerance: Good ?METS(-) hypertension(-) CAD and (-) Past MI negative cardio ROS ? ?(-) dysrhythmias  ?Rhythm:Regular Rate:Normal ?- Systolic murmurs ? ?  ?Neuro/Psych ?negative neurological ROS ? negative psych ROS  ? GI/Hepatic ?neg GERD  ,(+)  ?  ? (-) substance abuse ? ,   ?Endo/Other  ?diabetes, Poorly Controlled, Oral Hypoglycemic Agents ? Renal/GU ?negative Renal ROS  ? ?  ?Musculoskeletal ? ? Abdominal ?  ?Peds ? Hematology ?  ?Anesthesia Other Findings ?Past Medical History: ?No date: Diabetes mellitus without complication ([place]) ? Reproductive/Obstetrics ? ?  ? ? ? ? ? ? ? ? ? ? ? ? ? ?  ?  ? ? ? ? ? ? ? ? ?Anesthesia Physical ?Anesthesia Plan ? ?ASA: 2 ? ?Anesthesia Plan: MAC  ? ?Post-op Pain Management: Regional block*  ? ?Induction: Intravenous ? ?PONV Risk Score and Plan: 3 and 0 and Ondansetron and Midazolam ? ?Airway Management Planned: Nasal Cannula ? ?Additional Equipment: None ? ?Intra-op Plan:  ? ?Post-operative Plan:  ? ?Informed Consent: I have reviewed the patients History and Physical, chart, labs and discussed the procedure including the risks, benefits and alternatives for the proposed anesthesia with the patient or authorized representative who has  indicated his/her understanding and acceptance.  ? ? ? ?Dental advisory given ? ?Plan Discussed with: CRNA and Surgeon ? ?Anesthesia Plan Comments: (Plan for supraclav block for surgical anesthesia. ?Discussed risks of anesthesia with patient, including possibility of difficulty with spontaneous ventilation under anesthesia necessitating airway intervention, PONV, and rare risks such as cardiac or respiratory or neurological events, and allergic reactions. Discussed the role of CRNA in patient's perioperative care. Patient understands. ?Discussed r/b/a of supraclavicular nerve block, including:  ?- bleeding, infection, nerve damage ?- pneumothorax ?- shortness of breath from hemidiaphragmatic paralysis due to phrenic nerve blockade ?- poor or non functioning block. ?- reactions and toxicity to local anesthetic ?Patient understands. ?)  ? ? ? ? ? ? ?Anesthesia Quick Evaluation ? ?

## 2021-04-16 NOTE — ED Notes (Signed)
Called to [facility] spoke to [place] to Initiate consult to Hand Service ? ?

## 2021-04-16 NOTE — ED Provider Notes (Incomplete)
[facility] Provider Note    Event Date/Time   First MD Initiated Contact with Patient 04/16/21 0720     (approximate)   History   Hand Pain and Insect Bite   HPI  Brailon Gojcaj is a 61 y.o. male  with history of diabetes and as listed in EMR presents to the emergency department for evaluation of left hand pain and swelling. While working in some bushes last Friday (8 days ago), he felt a sharp pain in the hand. Hand has progressively swollen and become red with large open area and drainage. No known fever.      Physical Exam   Triage Vital Signs: ED Triage Vitals  Enc Vitals Group     BP 04/16/21 0722 127/68     Pulse Rate 04/16/21 0722 95     Resp 04/16/21 0722 16     Temp 04/16/21 0722 98.9 F (37.2 C)     Temp Source 04/16/21 0722 Oral     SpO2 04/16/21 0722 99 %     Weight 04/16/21 0713 140 lb (63.5 kg)     Height 04/16/21 0713 5\' 5"  (1.651 m)     Head Circumference --      Peak Flow --      Pain Score 04/16/21 0712 6     Pain Loc --      Pain Edu? --      Excl. in [place]? --     Most recent vital signs: Vitals:   04/16/21 1330 04/16/21 1400  BP: 131/69 125/71  Pulse: 81 78  Resp: 16 16  Temp:    SpO2: 96% 95%    General: Awake, no distress.  CV:  Good peripheral perfusion.  Resp:  Normal effort.  Abd:  No distention.  Other:          ED Results / Procedures / Treatments   Labs (all labs ordered are listed, but only abnormal results are displayed) Labs Reviewed  CBC WITH DIFFERENTIAL/PLATELET - Abnormal; Notable for the following components:      Result Value   WBC 22.8 (*)    Platelets 428 (*)    Neutro Abs 20.5 (*)    Monocytes Absolute 1.2 (*)    Abs Immature Granulocytes 0.14 (*)    All other components within normal limits  BASIC METABOLIC PANEL - Abnormal; Notable for the following components:   Sodium 133 (*)    Chloride 97 (*)    Glucose, Bld 430 (*)    Calcium 8.7 (*)    All other  components within normal limits  CBG MONITORING, ED - Abnormal; Notable for the following components:   Glucose-Capillary 305 (*)    All other components within normal limits  CBG MONITORING, ED - Abnormal; Notable for the following components:   Glucose-Capillary 251 (*)    All other components within normal limits  RESP PANEL BY RT-PCR (FLU A&B, COVID) ARPGX2  CULTURE, BLOOD (ROUTINE X 2)  CULTURE, BLOOD (ROUTINE X 2)  LACTIC ACID, PLASMA  LACTIC ACID, PLASMA     EKG  Not indicated.   RADIOLOGY  Image and radiology report reviewed by me.  X-ray of the left hand shows no retained foreign body or acute bony abnormality.  CT with contrast of the left hand including the wrist shows no focal abscess there is a an 8 cc fluid collection that tracks along the superficial fascial margin of the abductor digiti minimi muscle.  No findings of  osteomyelitis, foreign body, or gas in soft tissue.  PROCEDURES:  Critical Care performed: No  Procedures   MEDICATIONS ORDERED IN ED: Medications  sodium chloride 0.9 % bolus 1,000 mL (0 mLs Intravenous Stopped 04/16/21 0901)  oxyCODONE (Oxy IR/ROXICODONE) immediate release tablet 5 mg (5 mg Oral Given 04/16/21 0810)  cefTRIAXone (ROCEPHIN) 1 g in sodium chloride 0.9 % 100 mL IVPB (0 g Intravenous Stopped 04/16/21 0955)  iohexol (OMNIPAQUE) 300 MG/ML solution 75 mL (75 mLs Intravenous Contrast Given 04/16/21 0914)  sodium chloride 0.9 % bolus 1,000 mL (0 mLs Intravenous Stopped 04/16/21 1253)     IMPRESSION / MDM / ASSESSMENT AND PLAN / ED COURSE   I have reviewed the triage note.  Differential diagnosis includes, but is not limited to, Cellulitis, abscess, necrotizing infection, osteomyelitis.  61 year old male presenting to the emergency department for treatment and evaluation of the left hand pain and swelling after he felt a sharp pain in the hand while working in some bushes 8 days ago.  See HPI for further details.  See photos in the  exam section.  Labs show a leukocytosis of 22.8 with a left shift.  BMP shows a glucose of 430 with a normal anion gap.  Lactic acid is normal at 1.1.   1 L normal saline has infused as well as 1 g of Rocephin.  We will have staff recheck CBG and give a second liter of fluids.  Plan will be to admit for IV antibiotics and monitoring.  Orthopedic consult has been requested.  Awaiting orthopedic response for over an hour.  ED secretary attempting to get in touch with Dr. Gaspar Bidding.   2:25pm: Continue to await return call from specialist. Per Network engineer, he has been in a case in the OR for several hours and is still scrubbed in. Patient resting comfortably. Glucose is down to 251.    FINAL CLINICAL IMPRESSION(S) / ED DIAGNOSES   Final diagnoses:  None     Rx / DC Orders   ED Discharge Orders     None        Note:  This document was prepared using Dragon voice recognition software and may include unintentional dictation errors.

## 2021-04-16 NOTE — Brief Op Note (Addendum)
04/16/2021 ? ?9:48 PM ? ?PATIENT:  Benjamin Brandt  61 y.o. male ? ?PRE-OPERATIVE DIAGNOSIS:  hand infection ? ?POST-OPERATIVE DIAGNOSIS:  hand infection ? ?PROCEDURE:  Procedure(s): ?IRRIGATION AND DEBRIDEMENT HAND (Left) ? ?SURGEON:  Surgeon(s) and Role: ?   * Labrittany Wechter, Osvaldo Human, MD - Primary ? ?PHYSICIAN ASSISTANT:  ? ?ASSISTANTS: none  ? ?ANESTHESIA:   regional ? ?EBL:  5cc ? ?BLOOD ADMINISTERED:none ? ?DRAINS: Penrose drain in the Left hand and Idoform dressing in the left hand   ? ?LOCAL MEDICATIONS USED:  NONE ? ?SPECIMEN:  Source of Specimen:  Left hand deep swab cultures x3, left hand tissue culture x1  ? ?DISPOSITION OF SPECIMEN:   Micro ? ?COUNTS:  YES ? ?TOURNIQUET:  27 min ? ?DICTATION: .Note written in EPIC ? ?PLAN OF CARE: Admit to inpatient  ? ?PATIENT DISPOSITION:  PACU - hemodynamically stable. ?  ?Delay start of Pharmacological VTE agent (>24hrs) due to surgical blood loss or risk of bleeding: no ? ? ?Post operative antibiotic coverage per primary team. Broad spectrum antibiotics recommenced until cultures result.  ? ?

## 2021-04-17 ENCOUNTER — Other Ambulatory Visit: Payer: Self-pay

## 2021-04-17 LAB — CBC
HCT: 35.5 % — ABNORMAL LOW (ref 39.0–52.0)
Hemoglobin: 12.1 g/dL — ABNORMAL LOW (ref 13.0–17.0)
MCH: 30.9 pg (ref 26.0–34.0)
MCHC: 34.1 g/dL (ref 30.0–36.0)
MCV: 90.6 fL (ref 80.0–100.0)
Platelets: 395 10*3/uL (ref 150–400)
RBC: 3.92 MIL/uL — ABNORMAL LOW (ref 4.22–5.81)
RDW: 12 % (ref 11.5–15.5)
WBC: 23.5 10*3/uL — ABNORMAL HIGH (ref 4.0–10.5)
nRBC: 0 % (ref 0.0–0.2)

## 2021-04-17 LAB — BASIC METABOLIC PANEL
Anion gap: 9 (ref 5–15)
BUN: 13 mg/dL (ref 8–23)
CO2: 22 mmol/L (ref 22–32)
Calcium: 8.2 mg/dL — ABNORMAL LOW (ref 8.9–10.3)
Chloride: 106 mmol/L (ref 98–111)
Creatinine, Ser: 0.77 mg/dL (ref 0.61–1.24)
GFR, Estimated: >60 mL/min (ref >60)
Glucose, Bld: 215 mg/dL — ABNORMAL HIGH (ref 70–99)
Potassium: 3.8 mmol/L (ref 3.5–5.1)
Sodium: 137 mmol/L (ref 135–145)

## 2021-04-17 LAB — GLUCOSE, CAPILLARY
Glucose-Capillary: 200 mg/dL — ABNORMAL HIGH (ref 70–99)
Glucose-Capillary: 222 mg/dL — ABNORMAL HIGH (ref 70–99)
Glucose-Capillary: 264 mg/dL — ABNORMAL HIGH (ref 70–99)
Glucose-Capillary: 264 mg/dL — ABNORMAL HIGH (ref 70–99)

## 2021-04-17 LAB — HIV ANTIBODY (ROUTINE TESTING W REFLEX): HIV Screen 4th Generation wRfx: NONREACTIVE

## 2021-04-17 MED ORDER — MORPHINE SULFATE (PF) 2 MG/ML IV SOLN: 2.0000 mg | INTRAVENOUS | Status: DC | PRN

## 2021-04-17 MED ORDER — NICOTINE 21 MG/24HR TD PT24
21.0000 mg | MEDICATED_PATCH | Freq: Every day | TRANSDERMAL | Status: DC
  Administered 2021-04-17 – 2021-04-18 (×2): 21 mg via TRANSDERMAL
  Filled 2021-04-17 (×2): qty 1

## 2021-04-17 NOTE — Progress Notes (Signed)
PROGRESS NOTE    Benjamin Brandt  NWG:[id number] DOB: 06/19/1960 DOA: 04/16/2021 PCP: Pcp, No    Brief Narrative:  [age] with medical history of non-insulin-dependent diabetes mellitus, currently on p.o. antiglycemic agents, presents emergency department for chief concerns of left hand wound.  Per patient wound started on 3/6.  He was working with plants and bushes.  Developed left hand wound and resultant infection.  No drainage just serous fluid.  Moderate pain.  Orthopedic surgery consulted.  Patient taken to the operating room for irrigation and debridement on 3/11.  Tolerated procedure well.  Postoperative course uncomplicated.   Assessment & Plan:   Principal Problem:   Hand abscess Active Problems:   Hyperglycemia   Leukocytosis   Cellulitis and abscess of hand  Hand cellulitis Leukocytosis, secondary to above Secondary to traumatic puncture wound 8 days prior to admission Status post irrigation and debridement on 3/11 Plan: Continue antibiotics Follow cultures Advance diet As needed pain medication Orthopedic follow-up Daily labs  Hyperglycemia Patient states he is diabetic States he takes pills but no documented diabetic medications Plan: Car modified diet Sliding scale coverage Check hemoglobin A1c   DVT prophylaxis: SQ Lovenox Code Status: Full Family Communication: None today Disposition Plan: Status is: Observation The patient will require care spanning > 2 midnights and should be moved to inpatient because: Patient remains on IV antibiotics for hand cellulitis status post I&D   Level of care: Med-Surg  Consultants:  Orthopedics  Procedures:  Left hand irrigation and debridement 3/11  Antimicrobials: Cefepime Vancomycin   Subjective: Seen and examined.  History conducted in [place].  Patient feels well overall.  Pain minimal  Objective: Vitals:   04/16/21 2356 04/17/21 0056 04/17/21 0411 04/17/21 0821  BP: 122/66 120/60 110/62  121/64  Pulse: 80 89 84 78  Resp: 18 20 20    Temp: 98.5 F (36.9 C) 98.2 F (36.8 C) 100 F (37.8 C) 98.9 F (37.2 C)  TempSrc:  Oral Oral   SpO2: 98% 96% 97% 98%  Weight:      Height:        Intake/Output Summary (Last 24 hours) at 04/17/2021 1018 Last data filed at 04/17/2021 1005 Gross per 24 hour  Intake 1875.11 ml  Output --  Net 1875.11 ml   Filed Weights   04/16/21 0713  Weight: 63.5 kg    Examination:  General exam: Appears calm and comfortable  Respiratory system: Clear to auscultation. Respiratory effort normal. Cardiovascular system: S1-S2, RRR, no murmurs, no pedal edema Gastrointestinal system: Soft, NT/ND, normal bowel sounds Central nervous system: Alert and oriented. No focal neurological deficits. Extremities: Symmetric 5 x 5 power.  Left hand in surgical wrap, not removed Skin: No rashes, lesions or ulcers Psychiatry: Judgement and insight appear normal. Mood & affect appropriate.     Data Reviewed: I have personally reviewed following labs and imaging studies  CBC: Recent Labs  Lab 04/16/21 0742 04/17/21 0528  WBC 22.8* 23.5*  NEUTROABS 20.5*  --   HGB 14.0 12.1*  HCT 40.7 35.5*  MCV 91.5 90.6  PLT 428* 395   Basic Metabolic Panel: Recent Labs  Lab 04/16/21 0742 04/17/21 0528  NA 133* 137  K 4.0 3.8  CL 97* 106  CO2 26 22  GLUCOSE 430* 215*  BUN 12 13  CREATININE 0.78 0.77  CALCIUM 8.7* 8.2*   GFR: Estimated Creatinine Clearance: 83.3 mL/min (by C-G formula based on SCr of 0.77 mg/dL). Liver Function Tests: No results for input(s): AST, ALT,  ALKPHOS, BILITOT, PROT, ALBUMIN in the last 168 hours. No results for input(s): LIPASE, AMYLASE in the last 168 hours. No results for input(s): AMMONIA in the last 168 hours. Coagulation Profile: No results for input(s): INR, PROTIME in the last 168 hours. Cardiac Enzymes: No results for input(s): CKTOTAL, CKMB, CKMBINDEX, TROPONINI in the last 168 hours. BNP (last 3 results) No  results for input(s): PROBNP in the last 8760 hours. HbA1C: No results for input(s): HGBA1C in the last 72 hours. CBG: Recent Labs  Lab 04/16/21 1115 04/16/21 1315 04/16/21 2214 04/17/21 0819  GLUCAP 305* 251* 196* 200*   Lipid Profile: No results for input(s): CHOL, HDL, LDLCALC, TRIG, CHOLHDL, LDLDIRECT in the last 72 hours. Thyroid Function Tests: No results for input(s): TSH, T4TOTAL, FREET4, T3FREE, THYROIDAB in the last 72 hours. Anemia Panel: No results for input(s): VITAMINB12, FOLATE, FERRITIN, TIBC, IRON, RETICCTPCT in the last 72 hours. Sepsis Labs: Recent Labs  Lab 04/16/21 0742 04/16/21 0928  LATICACIDVEN 1.1 1.3    Recent Results (from the past 240 hour(s))  Culture, blood (routine x 2)     Status: None (Preliminary result)   Collection Time: 04/16/21  7:42 AM   Specimen: BLOOD  Result Value Ref Range Status   Specimen Description BLOOD BLOOD RIGHT FOREARM  Final   Special Requests   Final    BOTTLES DRAWN AEROBIC AND ANAEROBIC Blood Culture results may not be optimal due to an excessive volume of blood received in culture bottles   Culture   Final    NO GROWTH < 24 HOURS Performed at [facility], [address] [place], [place] [address]    Report Status PENDING  Incomplete  Resp Panel by RT-PCR (Flu A&B, Covid) Nasopharyngeal Swab     Status: None   Collection Time: 04/16/21  7:42 AM   Specimen: Nasopharyngeal Swab; Nasopharyngeal(NP) swabs in vial transport medium  Result Value Ref Range Status   SARS Coronavirus 2 by RT PCR NEGATIVE NEGATIVE Final    Comment: (NOTE) SARS-CoV-2 target nucleic acids are NOT DETECTED.  The SARS-CoV-2 RNA is generally detectable in upper respiratory specimens during the acute phase of infection. The lowest concentration of SARS-CoV-2 viral copies this assay can detect is 138 copies/mL. A negative result does not preclude SARS-Cov-2 infection and should not be used as the sole basis for treatment or other  patient management decisions. A negative result may occur with  improper specimen collection/handling, submission of specimen other than nasopharyngeal swab, presence of viral mutation(s) within the areas targeted by this assay, and inadequate number of viral copies(<138 copies/mL). A negative result must be combined with clinical observations, patient history, and epidemiological information. The expected result is Negative.  Fact Sheet for Patients:  BloggerCourse.com  Fact Sheet for Healthcare Providers:  SeriousBroker.it  This test is no t yet approved or cleared by the [place] FDA and  has been authorized for detection and/or diagnosis of SARS-CoV-2 by FDA under an Emergency Use Authorization (EUA). This EUA will remain  in effect (meaning this test can be used) for the duration of the COVID-19 declaration under Section 564(b)(1) of the Act, 21 U.S.C.section 360bbb-3(b)(1), unless the authorization is terminated  or revoked sooner.       Influenza A by PCR NEGATIVE NEGATIVE Final   Influenza B by PCR NEGATIVE NEGATIVE Final    Comment: (NOTE) The Xpert Xpress SARS-CoV-2/FLU/RSV plus assay is intended as an aid in the diagnosis of influenza from Nasopharyngeal swab specimens and should not  be used as a sole basis for treatment. Nasal washings and aspirates are unacceptable for Xpert Xpress SARS-CoV-2/FLU/RSV testing.  Fact Sheet for Patients: BloggerCourse.com  Fact Sheet for Healthcare Providers: SeriousBroker.it  This test is not yet approved or cleared by the [place] FDA and has been authorized for detection and/or diagnosis of SARS-CoV-2 by FDA under an Emergency Use Authorization (EUA). This EUA will remain in effect (meaning this test can be used) for the duration of the COVID-19 declaration under Section 564(b)(1) of the Act, 21 U.S.C. section  360bbb-3(b)(1), unless the authorization is terminated or revoked.  Performed at [facility], [address] [place], [place] [address]   Culture, blood (routine x 2)     Status: None (Preliminary result)   Collection Time: 04/16/21  9:28 AM   Specimen: BLOOD  Result Value Ref Range Status   Specimen Description BLOOD BLOOD RIGHT FOREARM  Final   Special Requests   Final    BOTTLES DRAWN AEROBIC AND ANAEROBIC Blood Culture results may not be optimal due to an excessive volume of blood received in culture bottles   Culture   Final    NO GROWTH < 24 HOURS Performed at [facility], [address] [place], [place] [address]    Report Status PENDING  Incomplete  MRSA Next Gen by PCR, Nasal     Status: None   Collection Time: 04/16/21  4:21 PM   Specimen: Nasal Mucosa; Nasal Swab  Result Value Ref Range Status   MRSA by PCR Next Gen NOT DETECTED NOT DETECTED Final    Comment: (NOTE) The GeneXpert MRSA Assay (FDA approved for NASAL specimens only), is one component of a comprehensive MRSA colonization surveillance program. It is not intended to diagnose MRSA infection nor to guide or monitor treatment for MRSA infections. Test performance is not FDA approved in patients less than 60 years old. Performed at [facility], [address] [place], [place] [address]   Aerobic/Anaerobic Culture w Gram Stain (surgical/deep wound)     Status: None (Preliminary result)   Collection Time: 04/16/21  9:07 PM   Specimen: PATH Other; Wound  Result Value Ref Range Status   Specimen Description   Final    TISSUE LEFT HAND CX DEEP Performed at [facility], [address] [place], [place] [address]    Special Requests   Final    NONE Performed at [facility], [address] [place], [place] [address]    Gram Stain   Final    NO SQUAMOUS EPITHELIAL CELLS SEEN FEW WBC SEEN FEW GRAM POSITIVE COCCI Performed at [facility],  [address] [place], [place] [address]    Culture PENDING  Incomplete   Report Status PENDING  Incomplete  Aerobic/Anaerobic Culture w Gram Stain (surgical/deep wound)     Status: None (Preliminary result)   Collection Time: 04/16/21  9:08 PM   Specimen: PATH Other; Wound  Result Value Ref Range Status   Specimen Description   Final    HAND LEFT HAND CX DEEP1 Performed at [facility], [address] [place], [place] [address]    Special Requests   Final    NONE Performed at [facility], [address] [place], [place] [address]    Gram Stain   Final    NO SQUAMOUS EPITHELIAL CELLS SEEN MODERATE WBC SEEN MODERATE GRAM POSITIVE COCCI Performed at [facility], [address], [place], [place] [address]  Culture PENDING  Incomplete   Report Status PENDING  Incomplete  Aerobic/Anaerobic Culture w Gram Stain (surgical/deep wound)     Status: None (Preliminary result)   Collection Time: 04/16/21  9:08 PM   Specimen: PATH Other; Wound  Result Value Ref Range Status   Specimen Description   Final    WOUND LEFT HAND CX DEEP2 Performed at [facility], [address] [place], [place] [address]    Special Requests   Final    NONE Performed at [facility], [address] [place], [place] [address]    Gram Stain   Final    NO SQUAMOUS EPITHELIAL CELLS SEEN MODERATE WBC SEEN MODERATE GRAM POSITIVE COCCI Performed at [facility], [address], [place], [place] [address]    Culture PENDING  Incomplete   Report Status PENDING  Incomplete  Aerobic/Anaerobic Culture w Gram Stain (surgical/deep wound)     Status: None (Preliminary result)   Collection Time: 04/16/21  9:12 PM   Specimen: PATH Other; Wound  Result Value Ref Range Status   Specimen Description   Final    WOUND LEFT HAND CX DEEP3 Performed at [facility], [address], [place], [place] [address]    Special Requests   Final    NONE Performed at  [facility], [address] [place], [place] [address]    Gram Stain   Final    NO SQUAMOUS EPITHELIAL CELLS SEEN FEW WBC SEEN MODERATE GRAM POSITIVE COCCI Performed at [facility], [address], [place], [place] [address]    Culture PENDING  Incomplete   Report Status PENDING  Incomplete         Radiology Studies: CT HAND LEFT W CONTRAST  Result Date: 04/16/2021 CLINICAL DATA:  Soft tissue infection EXAM: CT OF THE UPPER LEFT EXTREMITY WITH CONTRAST TECHNIQUE: Multidetector CT imaging of the left hand was performed according to the standard protocol following intravenous contrast administration. RADIATION DOSE REDUCTION: This exam was performed according to the departmental dose-optimization program which includes automated exposure control, adjustment of the mA and/or kV according to patient size and/or use of iterative reconstruction technique. CONTRAST:  52mL OMNIPAQUE IOHEXOL 300 MG/ML  SOLN COMPARISON:  Radiographs 04/16/2021 FINDINGS: Please note, I did not have the technologist reperformed all axial, sagittal, and coronal MPR is to match protocol because today's exam is primarily to assess for infection rather than to assess for fracture or specific bony abnormality. Bones/Joint/Cartilage Limited assessment due to imaging planes. No bony destructive findings characteristic of osteomyelitis. Degenerative subcortical cyst formation in the proximal lunate. Questionable lucent endosteal scalloping anteriorly in the distal radius, significance uncertain. Ligaments Suboptimally assessed by CT. Muscles and Tendons No intramuscular abscess identified. However, there is a 2.6 by 1.3 by 4.4 cm (volume = 7.8 cm^3) fluid collection tracking along the superficial fascia margin of the abductor digiti minimi muscle for example on image 68 series 3. There is no gas within this collection, and no substantial enhancement along the margins of this fluid collection. Soft tissues Diffuse  subcutaneous edema in the distal forearm, wrist, and tracking into the dorsum of the hand. No gas tracks in the soft tissues. Arterial atherosclerosis noted. Do not see a foreign body. IMPRESSION: 1. Diffuse edema in the distal forearm, wrist, tracking in the dorsum of the hand towards the fingers. No findings of osteomyelitis, foreign body, or gas in the soft tissues. 2. Proximally 8 cc fluid collection tracks along the superficial fascial  margin of the abductor digiti minimi muscle. There is no substantial enhancement of the margins of this collection to indicate that it is an abscess in this may simply represent a reactive but noninfected fluid collection. Incipient abscess cannot be completely excluded. Electronically Signed   By: Gaylyn Rong M.D.   On: 04/16/2021 10:24   DG Hand 2 View Left  Result Date: 04/16/2021 CLINICAL DATA:  61 year old male with history of swelling. Evaluate for foreign body. EXAM: LEFT HAND - 2 VIEW COMPARISON:  No priors. FINDINGS: There is no evidence of fracture or dislocation. There is no evidence of arthropathy or other focal bone abnormality. Numerous vascular calcifications are noted. Soft tissues are otherwise unremarkable. Specifically, no radiopaque foreign body identified. IMPRESSION: 1. No unexpected radiopaque foreign body in the soft tissues. 2. No acute bony abnormality. 3. Atherosclerosis. Electronically Signed   By: Trudie Reed M.D.   On: 04/16/2021 07:59   [place] OR NERVE BLOCK-IMAGE ONLY [facility])  Result Date: 04/16/2021 There is no interpretation for this exam.  This order is for images obtained during a surgical procedure.  Please See "Surgeries" Tab for more information regarding the procedure.        Scheduled Meds:  enoxaparin (LOVENOX) injection  40 mg Subcutaneous Q24H   insulin aspart  0-15 Units Subcutaneous TID WC   insulin aspart  0-5 Units Subcutaneous QHS   nicotine  21 mg Transdermal Daily   Continuous Infusions:  ceFEPime  (MAXIPIME) IV 2 g (04/17/21 0610)   vancomycin       LOS: 0 days       Tresa Moore, MD [facility]   If 7PM-7AM, please contact night-coverage  04/17/2021, 10:18 AM

## 2021-04-17 NOTE — Op Note (Signed)
OPERATIVE NOTE ? ?DATE OF SURGERY:  04/16/2021 ? ?PATIENT NAME:  Benjamin Brandt   ?DOB: 23-Dec-1960  ?MRN: [id number] ? ? ?PRE-OPERATIVE DIAGNOSIS:  Left hand deep space abscess ? ?POST-OPERATIVE DIAGNOSIS:  Same ? ?PROCEDURE:  Left hand irrigation and debridement ? ?SURGEON:  Craig Guess M.D.  ? ?ASSISTANT: None ? ?ANESTHESIA: regional ? ?ESTIMATED BLOOD LOSS: 5 mL ? ?FLUIDS REPLACED: See anesthesia record ? ?TOURNIQUET TIME: 27 min  ? ?DRAINS: Penrose, iodoform packing ? ?IMPLANTS UTILIZED: None ? ?INDICATIONS FOR SURGERY: [name] is a 61 y.o. year old male who has been seen for complaints of Left hand infection. After discussion of the risks and benefits of surgical intervention, the patient expressed understanding of the risks benefits and agree with plans for Left hand irrigation and debridement.  ? ?PROCEDURE IN DETAIL: The patient was met in the pre-operative bay, his consent was verified and surgical site was marked. Regional anesthesia was provided. The patient was brought to the operating room and his extremity was prepped and draped in standard sterile fashion. A timeout was performed confirming correct patient and procedure. Antibiotics were held until after cultures were taken. With gravity exsanguination the tourniquet was insufflated. ? ?The wound was roughly 3 x 3cm over the hypothenar eminence. An incision was made along the lateral border of the eschar and diagonal from the distal aspect of the wound over the small finger A1 pulley. The wound was extended proximally toward the carpal tunnel. Upon incision, copious amounts of purulent material was expressed. The deep wound was swabbed x 3 and sent for culture. The eschar was debrided and palmar fascia was excised and sent as a tissue culture. Antibiotics were then given.  ? ?The common digital neurovascular bundle to the small finger was identified and protected. Blunt dissection proximally and distally was performed to expose  all identifiable pockets of purulence. The flexor tendon sheath did not appear to be involved at this point. There was no purulence expressed with compression of the thenar eminence it it was soft. There was no purulence expressed with compression of forearm or carpal tunnel, distal forearm was soft.  ? ?1L of NS was run through the wound after tissue debridement. The tourniquet was let down and there was no evidence of digital vascular injury. There was no significant bleeding. The wound was loosely approximated in order to cover the exoposed neurovascular structures. A penrose drain was placed deep, spanning the entirety of the wound. A 2x2cm defect remains in the hypothenar eminence. This was packed with iodoform, which was also placed proximally and distally in the dead space.  ? ?Aftercare: The patient will get broad spectrum antibiotics until cultures are back. He may begin soaks in hydrotherapy in 2 days. I believe the patient would be best served with follow up with a hand surgeon for the possibility of needing soft tissue coverage of his defect.  ? ?Nida Boatman, M.D.  ? ?

## 2021-04-17 NOTE — Progress Notes (Signed)
Subjective:  Patient reports pain as mild.  No other complaints.  Objective:   VITALS:   Vitals:   04/17/21 0056 04/17/21 0411 04/17/21 0821 04/17/21 1522  BP: 120/60 110/62 121/64 104/61  Pulse: 89 84 78 75  Resp: 20 20    Temp: 98.2 F (36.8 C) 100 F (37.8 C) 98.9 F (37.2 C) 98.4 F (36.9 C)  TempSrc: Oral Oral    SpO2: 96% 97% 98% 97%  Weight:      Height:        PHYSICAL EXAM:  Incision: dressing C/D/I Compartment soft Good cap refill  LABS  Results for orders placed or performed during the hospital encounter of 04/16/21 (from the past 24 hour(s))  MRSA Next Gen by PCR, Nasal     Status: None   Collection Time: 04/16/21  4:21 PM   Specimen: Nasal Mucosa; Nasal Swab  Result Value Ref Range   MRSA by PCR Next Gen NOT DETECTED NOT DETECTED  Aerobic/Anaerobic Culture w Gram Stain (surgical/deep wound)     Status: None (Preliminary result)   Collection Time: 04/16/21  9:07 PM   Specimen: PATH Other; Wound  Result Value Ref Range   Specimen Description      TISSUE LEFT HAND CX DEEP Performed at [facility], [address] [place], [place] [address]    Special Requests      NONE Performed at [facility], [address] [place], [place] [address]    Gram Stain      NO SQUAMOUS EPITHELIAL CELLS SEEN FEW WBC SEEN FEW GRAM POSITIVE COCCI Performed at [facility], [address], [place], [place] [address]    Culture PENDING    Report Status PENDING   Aerobic/Anaerobic Culture w Gram Stain (surgical/deep wound)     Status: None (Preliminary result)   Collection Time: 04/16/21  9:08 PM   Specimen: PATH Other; Wound  Result Value Ref Range   Specimen Description      HAND LEFT HAND CX DEEP1 Performed at [facility], [address] [place], [place] [address]    Special Requests      NONE Performed at [facility], [address] [place], [place] [address]    Gram Stain      NO SQUAMOUS EPITHELIAL  CELLS SEEN MODERATE WBC SEEN MODERATE GRAM POSITIVE COCCI Performed at [facility], [address], [place], [place] [address]    Culture PENDING    Report Status PENDING   Aerobic/Anaerobic Culture w Gram Stain (surgical/deep wound)     Status: None (Preliminary result)   Collection Time: 04/16/21  9:08 PM   Specimen: PATH Other; Wound  Result Value Ref Range   Specimen Description      WOUND LEFT HAND CX DEEP2 Performed at [facility] [place] [facility], [address] [place], [place] [address]    Special Requests      NONE Performed at [facility], [address] [place], [place] [address]    Gram Stain      NO SQUAMOUS EPITHELIAL CELLS SEEN MODERATE WBC SEEN MODERATE GRAM POSITIVE COCCI Performed at [facility], [address] [place], [place] [address]    Culture PENDING    Report Status PENDING   Aerobic/Anaerobic Culture w Gram Stain (surgical/deep wound)     Status: None (Preliminary result)   Collection Time: 04/16/21  9:12 PM   Specimen: PATH Other; Wound  Result Value Ref Range   Specimen Description  WOUND LEFT HAND CX DEEP3 Performed at [facility], [address] [place], [place] [address]    Special Requests      NONE Performed at [facility], [address] [place], [place] [address]    Gram Stain      NO SQUAMOUS EPITHELIAL CELLS SEEN FEW WBC SEEN MODERATE GRAM POSITIVE COCCI Performed at [facility], [address] [place], [place] [address]    Culture PENDING    Report Status PENDING   CBG monitoring, ED     Status: Abnormal   Collection Time: 04/16/21 10:14 PM  Result Value Ref Range   Glucose-Capillary 196 (H) 70 - 99 mg/dL  Basic metabolic panel     Status: Abnormal   Collection Time: 04/17/21  5:28 AM  Result Value Ref Range   Sodium 137 135 - 145 mmol/L   Potassium 3.8 3.5 - 5.1 mmol/L   Chloride 106 98 - 111 mmol/L   CO2 22 22 - 32 mmol/L   Glucose, Bld 215 (H) 70 - 99 mg/dL    BUN 13 8 - 23 mg/dL   Creatinine, Ser 9.140.77 0.61 - 1.24 mg/dL   Calcium 8.2 (L) 8.9 - 10.3 mg/dL   GFR, Estimated >78>60 >29>60 mL/min   Anion gap 9 5 - 15  CBC     Status: Abnormal   Collection Time: 04/17/21  5:28 AM  Result Value Ref Range   WBC 23.5 (H) 4.0 - 10.5 K/uL   RBC 3.92 (L) 4.22 - 5.81 MIL/uL   Hemoglobin 12.1 (L) 13.0 - 17.0 g/dL   HCT 56.235.5 (L) 13.039.0 - 86.552.0 %   MCV 90.6 80.0 - 100.0 fL   MCH 30.9 26.0 - 34.0 pg   MCHC 34.1 30.0 - 36.0 g/dL   RDW 78.412.0 69.611.5 - 29.515.5 %   Platelets 395 150 - 400 K/uL   nRBC 0.0 0.0 - 0.2 %  Glucose, capillary     Status: Abnormal   Collection Time: 04/17/21  8:19 AM  Result Value Ref Range   Glucose-Capillary 200 (H) 70 - 99 mg/dL  Glucose, capillary     Status: Abnormal   Collection Time: 04/17/21 11:34 AM  Result Value Ref Range   Glucose-Capillary 222 (H) 70 - 99 mg/dL  Glucose, capillary     Status: Abnormal   Collection Time: 04/17/21  4:09 PM  Result Value Ref Range   Glucose-Capillary 264 (H) 70 - 99 mg/dL    CT HAND LEFT W CONTRAST  Result Date: 04/16/2021 CLINICAL DATA:  Soft tissue infection EXAM: CT OF THE UPPER LEFT EXTREMITY WITH CONTRAST TECHNIQUE: Multidetector CT imaging of the left hand was performed according to the standard protocol following intravenous contrast administration. RADIATION DOSE REDUCTION: This exam was performed according to the departmental dose-optimization program which includes automated exposure control, adjustment of the mA and/or kV according to patient size and/or use of iterative reconstruction technique. CONTRAST:  75mL OMNIPAQUE IOHEXOL 300 MG/ML  SOLN COMPARISON:  Radiographs 04/16/2021 FINDINGS: Please note, I did not have the technologist reperformed all axial, sagittal, and coronal MPR is to match protocol because today's exam is primarily to assess for infection rather than to assess for fracture or specific bony abnormality. Bones/Joint/Cartilage Limited assessment due to imaging planes. No bony  destructive findings characteristic of osteomyelitis. Degenerative subcortical cyst formation in the proximal lunate. Questionable lucent endosteal scalloping anteriorly in the distal radius, significance uncertain. Ligaments Suboptimally assessed by CT. Muscles and Tendons No intramuscular abscess identified.  However, there is a 2.6 by 1.3 by 4.4 cm (volume = 7.8 cm^3) fluid collection tracking along the superficial fascia margin of the abductor digiti minimi muscle for example on image 68 series 3. There is no gas within this collection, and no substantial enhancement along the margins of this fluid collection. Soft tissues Diffuse subcutaneous edema in the distal forearm, wrist, and tracking into the dorsum of the hand. No gas tracks in the soft tissues. Arterial atherosclerosis noted. Do not see a foreign body. IMPRESSION: 1. Diffuse edema in the distal forearm, wrist, tracking in the dorsum of the hand towards the fingers. No findings of osteomyelitis, foreign body, or gas in the soft tissues. 2. Proximally 8 cc fluid collection tracks along the superficial fascial margin of the abductor digiti minimi muscle. There is no substantial enhancement of the margins of this collection to indicate that it is an abscess in this may simply represent a reactive but noninfected fluid collection. Incipient abscess cannot be completely excluded. Electronically Signed   By: Gaylyn Rong M.D.   On: 04/16/2021 10:24   DG Hand 2 View Left  Result Date: 04/16/2021 CLINICAL DATA:  61 year old male with history of swelling. Evaluate for foreign body. EXAM: LEFT HAND - 2 VIEW COMPARISON:  No priors. FINDINGS: There is no evidence of fracture or dislocation. There is no evidence of arthropathy or other focal bone abnormality. Numerous vascular calcifications are noted. Soft tissues are otherwise unremarkable. Specifically, no radiopaque foreign body identified. IMPRESSION: 1. No unexpected radiopaque foreign body in the  soft tissues. 2. No acute bony abnormality. 3. Atherosclerosis. Electronically Signed   By: Trudie Reed M.D.   On: 04/16/2021 07:59   [place] OR NERVE BLOCK-IMAGE ONLY [facility])  Result Date: 04/16/2021 There is no interpretation for this exam.  This order is for images obtained during a surgical procedure.  Please See "Surgeries" Tab for more information regarding the procedure.    Assessment/Plan: 1 Day Post-Op   Principal Problem:   Hand abscess Active Problems:   Hyperglycemia   Leukocytosis   Cellulitis and abscess of hand   Advance diet Strict elevation of left hand Dressing change tomorrow and removal of drain Will likely require additional procedures for wound coverage, can be done as an outpatient Possible discharge tomorrow based on exam and final culture results Continue IV antibiotics   Benjamin Brandt , MD 04/17/2021, 4:28 PM

## 2021-04-18 ENCOUNTER — Other Ambulatory Visit: Payer: Self-pay

## 2021-04-18 ENCOUNTER — Encounter: Payer: Self-pay | Admitting: Orthopaedic Surgery

## 2021-04-18 LAB — GLUCOSE, CAPILLARY
Glucose-Capillary: 266 mg/dL — ABNORMAL HIGH (ref 70–99)
Glucose-Capillary: 273 mg/dL — ABNORMAL HIGH (ref 70–99)

## 2021-04-18 LAB — HEMOGLOBIN A1C
Hgb A1c MFr Bld: 12.5 % — ABNORMAL HIGH (ref 4.8–5.6)
Mean Plasma Glucose: 312 mg/dL

## 2021-04-18 MED ORDER — INSULIN STARTER KIT- PEN NEEDLES (SPANISH)
1.0000 | Freq: Once | Status: AC
  Administered 2021-04-18: 1
  Filled 2021-04-18: qty 1

## 2021-04-18 MED ORDER — LIVING WELL WITH DIABETES BOOK - IN SPANISH
Freq: Once | Status: AC
  Filled 2021-04-18: qty 1

## 2021-04-18 MED ORDER — AMOXICILLIN-POT CLAVULANATE 875-125 MG PO TABS
1.0000 | ORAL_TABLET | Freq: Two times a day (BID) | ORAL | 0 refills | Status: AC
  Filled 2021-04-18: qty 20, 10d supply, fill #0

## 2021-04-18 MED ORDER — BASAGLAR KWIKPEN 100 UNIT/ML ~~LOC~~ SOPN
10.0000 [IU] | PEN_INJECTOR | Freq: Every day | SUBCUTANEOUS | 0 refills | Status: AC
  Filled 2021-04-18: qty 15, 140d supply, fill #0

## 2021-04-18 MED ORDER — ACETAMINOPHEN 325 MG PO TABS: 650.0000 mg | ORAL_TABLET | Freq: Four times a day (QID) | ORAL | Status: AC | PRN

## 2021-04-18 MED ORDER — INSULIN GLARGINE-YFGN 100 UNIT/ML ~~LOC~~ SOLN
10.0000 [IU] | Freq: Every day | SUBCUTANEOUS | Status: DC
  Administered 2021-04-18: 10 [IU] via SUBCUTANEOUS
  Filled 2021-04-18: qty 0.1

## 2021-04-18 MED ORDER — INSULIN PEN NEEDLE 32G X 4 MM MISC
1.0000 | Freq: Three times a day (TID) | 0 refills | Status: AC
Start: 2021-04-18 — End: 2021-07-27
  Filled 2021-04-18: qty 100, 100d supply, fill #0

## 2021-04-18 MED ORDER — BLOOD GLUCOSE MONITOR KIT
PACK | 0 refills | Status: DC
  Filled 2021-04-18: qty 1, fill #0

## 2021-04-18 NOTE — TOC Initial Note (Signed)
Transition of Care (TOC) - Initial/Assessment Note  ? ? ?Patient Details  ?Name: Benjamin Brandt ?MRN: [id number] ?Date of Birth: 1960-10-17 ? ?Transition of Care (TOC) CM/SW Contact:    ?Beverly Sessions, RN ?Phone Number: ?04/18/2021, 2:30 PM ? ?Clinical Narrative:                 ?Patient to discharge today ?Met with patient along with spanish interpreter ? ?Patient states that his "boss" will pick up at discharge ? ?Made PCP appointment for [facility] 05/12/21 -  ?Patient confirms that he has glucometer at home ? ?Discharge medications filled at [facility]  and delivered to patient prior to discharge  ? ?  ?  ? ? ?Patient Goals and CMS Choice ?  ?  ?  ? ?Expected Discharge Plan and Services ?  ?  ?  ?  ?  ?Expected Discharge Date: 04/18/21               ?  ?  ?  ?  ?  ?  ?  ?  ?  ?  ? ?Prior Living Arrangements/Services ?  ?  ?  ?       ?  ?  ?  ?  ? ?Activities of Daily Living ?Home Assistive Devices/Equipment: None ?ADL Screening (condition at time of admission) ?Patient's cognitive ability adequate to safely complete daily activities?: Yes ?Is the patient deaf or have difficulty hearing?: No ?Does the patient have difficulty seeing, even when wearing glasses/contacts?: No ?Does the patient have difficulty concentrating, remembering, or making decisions?: No ?Patient able to express need for assistance with ADLs?: Yes ?Does the patient have difficulty dressing or bathing?: No ?Independently performs ADLs?: Yes (appropriate for developmental age) ?Does the patient have difficulty walking or climbing stairs?: No ?Weakness of Legs: None ?Weakness of Arms/Hands: Left ? ?Permission Sought/Granted ?  ?  ?   ?   ?   ?   ? ?Emotional Assessment ?  ?  ?  ?  ?  ?  ? ?Admission diagnosis:  Hand abscess [L02.519] ?Hyperglycemia [R73.9] ?Elective surgery [Z41.9] ?Cellulitis of left upper extremity [L03.114] ?Patient Active Problem List  ? Diagnosis Date Noted  ? Hand abscess 04/16/2021  ? Hyperglycemia  04/16/2021  ? Leukocytosis 04/16/2021  ? Cellulitis and abscess of hand 04/16/2021  ? ?PCP:  Pcp, No ?Pharmacy:   ?[facility] ?[address], [address] ?[place] [address] ?Phone: [phone] Fax: [phone] ? ? ? ? ?Social Determinants of Health (SDOH) Interventions ?  ? ?Readmission Risk Interventions ?No flowsheet data found. ? ? ?

## 2021-04-18 NOTE — Progress Notes (Addendum)
Inpatient Diabetes Program Recommendations ? ?AACE/ADA: New Consensus Statement on Inpatient Glycemic Control  ? ?Target Ranges:  Prepandial:   less than 140 mg/dL ?     Peak postprandial:   less than 180 mg/dL (1-2 hours) ?     Critically ill patients:  140 - 180 mg/dL  ? ? Latest Reference Range & Units 04/17/21 08:19 04/17/21 11:34 04/17/21 16:09 04/17/21 21:21 04/18/21 07:45  ?Glucose-Capillary 70 - 99 mg/dL 465 (H) 681 (H) 275 (H) 264 (H) 266 (H)  ? ? Latest Reference Range & Units 04/16/21 16:04  ?Hemoglobin A1C 4.8 - 5.6 % 12.5 (H)  ? ?Review of Glycemic Control ? ?Diabetes history: DM2 ?Outpatient Diabetes medications: None listed on home med list ?Current orders for Inpatient glycemic control: Semglee 10 units daily, Novolog 0-15 units TID with meals, Novolog 0-5 units QHS ? ?Inpatient Diabetes Program Recommendations:   ? ?HbgA1C: A1C 12.5% on 04/16/21 indicating an average glucose of 312 mg/dl over the past 2-3 months. If patient will be discharged on insulin, please provide Rx for Basaglar pens [phone]) and insulin pen needles [phone]). ? ?NOTE: Per chart, patient has DM2 hx and reported taking oral DM medication. Admitted with hand cellulitis. CBGs have consistently been in 200's since admitted and fasting glucose 266 mg/dl today. Semglee ordered this morning. Will plan to follow up with patient today regarding DM control and A1C. ? ?Addendum 04/18/21@11 :10-Spoke with patient at bedside with Orson Slick (Spanish interpreter) about diabetes and home regimen for diabetes control. Patient reported he did not have a doctor in [place] but states that he was getting medications from [place] and he last seen a doctor there about 9 months to 1 year ago.  Patient reports he takes 2 oral DM medications but no sure of the name of the medications. Patient then stated he went to clinic and get DM medications one time but not sure of clinic name.  Patient reports that he is taking DM medications consistently every day.  Patient  reports that his glucose is usually 180-upper 200's and sometimes higher.  Patient was not sure what an A1C was so explained what an A1C is. Discussed A1C results (12.5% on 04/16/21) and explained that current A1C indicates an average glucose of 312 mg/dl over the past 2-3 months. Discussed glucose and A1C goals. Discussed importance of checking CBGs and maintaining good CBG control to prevent long-term and short-term complications. Explained how hyperglycemia leads to damage within blood vessels which lead to the common complications seen with uncontrolled diabetes. Stressed to the patient the importance of improving glycemic control to prevent further complications from uncontrolled diabetes. Patient reports he has never taken insulin outpatient. Explained that given his A1C and to ensure his hand heals well, insulin would be recommended. Patient states he would be willing to take insulin if prescribed. Educated patient on insulin pen use at home.  Reviewed all steps of insulin pen including attachment of needle, 2-unit air shot, dialing up dose, giving injection, removing needle, disposal of sharps, storage of unused insulin, disposal of insulin etc. Patient able to provide successful return demonstration. Reviewed hypoglycemia along with treatment. Encouraged patient to check glucose and be sure to take his glucometer with him to to doctor appointments. Explained how the doctor can use the glucose values to continue to make adjustments with DM medications if needed. Judeth Cornfield, RN with TOC found out that patient went to [facility] and he last went on 04/16/21. She plans to call and make follow up appointment  and will assist with getting discharge medications from [facility] and have them brought to patient's room prior to discharge. Patient verbalized understanding of information discussed and reports no further questions at this time related to diabetes. [facility] has  Basaglar insulin pens. ? ? ?Thanks, ?Orlando Penner, RN, MSN, CDE ?Diabetes Coordinator ?Inpatient Diabetes Program ?[phone] (Team Pager from 8am to 5pm) ? ? ? ?

## 2021-04-18 NOTE — Progress Notes (Addendum)
Anthoni Camila Li to be D/C'd Home per MD order.  Discussed prescriptions and follow up appointments with the patient. Prescriptions given to patient, medication list explained in detail. Pt verbalized understanding. Pt appropriate for discharge lounge, awaiting meds from med management. Per case manger meds should be ready this afternoon. AC notified. Pt escorted by NT. ? ? ? ? ?Vitals:  ? 04/17/21 1945 04/18/21 0744  ?BP: 110/61 115/66  ?Pulse: 74 70  ?Resp:  16  ?Temp: 99.3 ?F (37.4 ?C) 99 ?F (37.2 ?C)  ?SpO2: 97% 97%  ? ? ? IV catheter discontinued intact. Site without signs and symptoms of complications. Dressing and pressure applied. Pt denies pain at this time. No complaints noted. ? ? ? ?Rigoberto Noel  ?

## 2021-04-18 NOTE — Discharge Summary (Signed)
Physician Discharge Summary  Benjamin Brandt NFA:[id number] DOB: 04-13-1960 DOA: 04/16/2021  PCP: Oneita Hurt, No  Admit date: 04/16/2021 Discharge date: 04/18/2021  Admitted From: Home Disposition: Home  Recommendations for Outpatient Follow-up:  Follow up with PCP in 1-2 weeks Follow-up with hand surgery specialist Dr. Stephenie Acres on 3/14 at 1 PM  Home Health: No Equipment/Devices: None  Discharge Condition: Stable CODE STATUS: Full Diet recommendation: Carb modified  Brief/Interim Summary:  Hand cellulitis Leukocytosis, secondary to above Secondary to traumatic puncture wound 8 days prior to admission Status post irrigation and debridement on 3/11 Tolerated procedure well with no complications Seen postop by orthopedic surgery Drain removed and cleared for discharge on 3/13 Plan: Discharge home.  Transition antibiotic regimen to Augmentin 875-125 1 tablet twice daily.  Total course of 10 days prescribed.  Patient will follow-up in [facility] with hand surgery specialist 3/14 at 1 PM   Hyperglycemia Type 2 diabetes mellitus uncontrolled with hyperglycemia Patient states he is diabetic States he takes pills but no documented diabetic medications Hemoglobin A1c 12.5, very poor control Plan: Diabetes coordinator consult.  TOC consult for PCP    Discharge Diagnoses:  Principal Problem:   Hand abscess Active Problems:   Hyperglycemia   Leukocytosis   Cellulitis and abscess of hand    Discharge Instructions  Discharge Instructions     Diet - low sodium heart healthy   Complete by: As directed    Increase activity slowly   Complete by: As directed    No wound care   Complete by: As directed       Allergies as of 04/18/2021   Not on File      Medication List     TAKE these medications    acetaminophen 325 MG tablet Commonly known as: TYLENOL Take 2 tablets (650 mg total) by mouth every 6 (six) hours as needed for mild pain (or Fever >/= 101).    amoxicillin-clavulanate 875-125 MG tablet Commonly known as: Augmentin Take 1 tablet by mouth 2 (two) times daily for 10 days.        Follow-up Information     Fredderick Erb, MD Follow up.   Specialty: Orthopedic Surgery Why: Follow up at [facility] with Dr Stephenie Acres               Not on File  Consultations: Orthopedic surgery   Procedures/Studies: CT HAND LEFT W CONTRAST  Result Date: 04/16/2021 CLINICAL DATA:  Soft tissue infection EXAM: CT OF THE UPPER LEFT EXTREMITY WITH CONTRAST TECHNIQUE: Multidetector CT imaging of the left hand was performed according to the standard protocol following intravenous contrast administration. RADIATION DOSE REDUCTION: This exam was performed according to the departmental dose-optimization program which includes automated exposure control, adjustment of the mA and/or kV according to patient size and/or use of iterative reconstruction technique. CONTRAST:  75mL OMNIPAQUE IOHEXOL 300 MG/ML  SOLN COMPARISON:  Radiographs 04/16/2021 FINDINGS: Please note, I did not have the technologist reperformed all axial, sagittal, and coronal MPR is to match protocol because today's exam is primarily to assess for infection rather than to assess for fracture or specific bony abnormality. Bones/Joint/Cartilage Limited assessment due to imaging planes. No bony destructive findings characteristic of osteomyelitis. Degenerative subcortical cyst formation in the proximal lunate. Questionable lucent endosteal scalloping anteriorly in the distal radius, significance uncertain. Ligaments Suboptimally assessed by CT. Muscles and Tendons No intramuscular abscess identified. However, there is a 2.6 by 1.3 by 4.4 cm (volume = 7.8 cm^3) fluid collection tracking along  the superficial fascia margin of the abductor digiti minimi muscle for example on image 68 series 3. There is no gas within this collection, and no substantial enhancement along the margins of this  fluid collection. Soft tissues Diffuse subcutaneous edema in the distal forearm, wrist, and tracking into the dorsum of the hand. No gas tracks in the soft tissues. Arterial atherosclerosis noted. Do not see a foreign body. IMPRESSION: 1. Diffuse edema in the distal forearm, wrist, tracking in the dorsum of the hand towards the fingers. No findings of osteomyelitis, foreign body, or gas in the soft tissues. 2. Proximally 8 cc fluid collection tracks along the superficial fascial margin of the abductor digiti minimi muscle. There is no substantial enhancement of the margins of this collection to indicate that it is an abscess in this may simply represent a reactive but noninfected fluid collection. Incipient abscess cannot be completely excluded. Electronically Signed   By: Gaylyn Rong M.D.   On: 04/16/2021 10:24   DG Hand 2 View Left  Result Date: 04/16/2021 CLINICAL DATA:  61 year old male with history of swelling. Evaluate for foreign body. EXAM: LEFT HAND - 2 VIEW COMPARISON:  No priors. FINDINGS: There is no evidence of fracture or dislocation. There is no evidence of arthropathy or other focal bone abnormality. Numerous vascular calcifications are noted. Soft tissues are otherwise unremarkable. Specifically, no radiopaque foreign body identified. IMPRESSION: 1. No unexpected radiopaque foreign body in the soft tissues. 2. No acute bony abnormality. 3. Atherosclerosis. Electronically Signed   By: Trudie Reed M.D.   On: 04/16/2021 07:59   [place] OR NERVE BLOCK-IMAGE ONLY [facility])  Result Date: 04/16/2021 There is no interpretation for this exam.  This order is for images obtained during a surgical procedure.  Please See "Surgeries" Tab for Brandt information regarding the procedure.      Subjective: Seen and examined on day of discharge.  Pain well controlled.  Stable for discharge home.  Discharge Exam: Vitals:   04/17/21 1945 04/18/21 0744  BP: 110/61 115/66  Pulse: 74 70  Resp:  16   Temp: 99.3 F (37.4 C) 99 F (37.2 C)  SpO2: 97% 97%   Vitals:   04/17/21 0821 04/17/21 1522 04/17/21 1945 04/18/21 0744  BP: 121/64 104/61 110/61 115/66  Pulse: 78 75 74 70  Resp:    16  Temp: 98.9 F (37.2 C) 98.4 F (36.9 C) 99.3 F (37.4 C) 99 F (37.2 C)  TempSrc:    Oral  SpO2: 98% 97% 97% 97%  Weight:      Height:        General: Pt is alert, awake, not in acute distress Cardiovascular: RRR, S1/S2 +, no rubs, no gallops Respiratory: CTA bilaterally, no wheezing, no rhonchi Abdominal: Soft, NT, ND, bowel sounds + Extremities: Left hand in surgical wraps.  Drain removed    The results of significant diagnostics from this hospitalization (including imaging, microbiology, ancillary and laboratory) are listed below for reference.     Microbiology: Recent Results (from the past 240 hour(s))  Culture, blood (routine x 2)     Status: None (Preliminary result)   Collection Time: 04/16/21  7:42 AM   Specimen: BLOOD  Result Value Ref Range Status   Specimen Description BLOOD BLOOD RIGHT FOREARM  Final   Special Requests   Final    BOTTLES DRAWN AEROBIC AND ANAEROBIC Blood Culture results may not be optimal due to an excessive volume of blood received in culture bottles   Culture  Final    NO GROWTH 2 DAYS Performed at [facility], [address] [place], [place] [address]    Report Status PENDING  Incomplete  Resp Panel by RT-PCR (Flu A&B, Covid) Nasopharyngeal Swab     Status: None   Collection Time: 04/16/21  7:42 AM   Specimen: Nasopharyngeal Swab; Nasopharyngeal(NP) swabs in vial transport medium  Result Value Ref Range Status   SARS Coronavirus 2 by RT PCR NEGATIVE NEGATIVE Final    Comment: (NOTE) SARS-CoV-2 target nucleic acids are NOT DETECTED.  The SARS-CoV-2 RNA is generally detectable in upper respiratory specimens during the acute phase of infection. The lowest concentration of SARS-CoV-2 viral copies this assay can detect is 138  copies/mL. A negative result does not preclude SARS-Cov-2 infection and should not be used as the sole basis for treatment or other patient management decisions. A negative result may occur with  improper specimen collection/handling, submission of specimen other than nasopharyngeal swab, presence of viral mutation(s) within the areas targeted by this assay, and inadequate number of viral copies(<138 copies/mL). A negative result must be combined with clinical observations, patient history, and epidemiological information. The expected result is Negative.  Fact Sheet for Patients:  BloggerCourse.com  Fact Sheet for Healthcare Providers:  SeriousBroker.it  This test is no t yet approved or cleared by the [place] FDA and  has been authorized for detection and/or diagnosis of SARS-CoV-2 by FDA under an Emergency Use Authorization (EUA). This EUA will remain  in effect (meaning this test can be used) for the duration of the COVID-19 declaration under Section 564(b)(1) of the Act, 21 U.S.C.section 360bbb-3(b)(1), unless the authorization is terminated  or revoked sooner.       Influenza A by PCR NEGATIVE NEGATIVE Final   Influenza B by PCR NEGATIVE NEGATIVE Final    Comment: (NOTE) The Xpert Xpress SARS-CoV-2/FLU/RSV plus assay is intended as an aid in the diagnosis of influenza from Nasopharyngeal swab specimens and should not be used as a sole basis for treatment. Nasal washings and aspirates are unacceptable for Xpert Xpress SARS-CoV-2/FLU/RSV testing.  Fact Sheet for Patients: BloggerCourse.com  Fact Sheet for Healthcare Providers: SeriousBroker.it  This test is not yet approved or cleared by the [place] FDA and has been authorized for detection and/or diagnosis of SARS-CoV-2 by FDA under an Emergency Use Authorization (EUA). This EUA will remain in effect (meaning  this test can be used) for the duration of the COVID-19 declaration under Section 564(b)(1) of the Act, 21 U.S.C. section 360bbb-3(b)(1), unless the authorization is terminated or revoked.  Performed at [facility], [address] [place], [place] [address]   Culture, blood (routine x 2)     Status: None (Preliminary result)   Collection Time: 04/16/21  9:28 AM   Specimen: BLOOD  Result Value Ref Range Status   Specimen Description BLOOD BLOOD RIGHT FOREARM  Final   Special Requests   Final    BOTTLES DRAWN AEROBIC AND ANAEROBIC Blood Culture results may not be optimal due to an excessive volume of blood received in culture bottles   Culture   Final    NO GROWTH 2 DAYS Performed at [facility], [address], [place], [place] [address]    Report Status PENDING  Incomplete  MRSA Next Gen by PCR, Nasal     Status: None   Collection Time: 04/16/21  4:21 PM   Specimen: Nasal Mucosa; Nasal Swab  Result Value Ref Range Status   MRSA by  PCR Next Gen NOT DETECTED NOT DETECTED Final    Comment: (NOTE) The GeneXpert MRSA Assay (FDA approved for NASAL specimens only), is one component of a comprehensive MRSA colonization surveillance program. It is not intended to diagnose MRSA infection nor to guide or monitor treatment for MRSA infections. Test performance is not FDA approved in patients less than 85 years old. Performed at [facility], [address] [place], [place] [address]   Aerobic/Anaerobic Culture w Gram Stain (surgical/deep wound)     Status: None (Preliminary result)   Collection Time: 04/16/21  9:07 PM   Specimen: PATH Other; Wound  Result Value Ref Range Status   Specimen Description   Final    TISSUE LEFT HAND CX DEEP Performed at [facility], [address], [place], [place] [address]    Special Requests   Final    NONE Performed at [facility], [address] [place], [place] [address]    Gram Stain   Final     NO SQUAMOUS EPITHELIAL CELLS SEEN FEW WBC SEEN FEW GRAM POSITIVE COCCI    Culture   Final    MODERATE GROUP B STREP(S.AGALACTIAE)ISOLATED TESTING AGAINST S. AGALACTIAE NOT ROUTINELY PERFORMED DUE TO PREDICTABILITY OF AMP/PEN/VAN SUSCEPTIBILITY. CULTURE REINCUBATED FOR BETTER GROWTH Performed at [facility], [address], [place], [place] [address]    Report Status PENDING  Incomplete  Aerobic/Anaerobic Culture w Gram Stain (surgical/deep wound)     Status: None (Preliminary result)   Collection Time: 04/16/21  9:08 PM   Specimen: PATH Other; Wound  Result Value Ref Range Status   Specimen Description   Final    HAND LEFT HAND CX DEEP1 Performed at [facility], [address] [place], [place] [address]    Special Requests   Final    NONE Performed at [facility], [address] [place], [place] [address]    Gram Stain   Final    NO SQUAMOUS EPITHELIAL CELLS SEEN MODERATE WBC SEEN MODERATE GRAM POSITIVE COCCI    Culture   Final    MODERATE GROUP B STREP(S.AGALACTIAE)ISOLATED TESTING AGAINST S. AGALACTIAE NOT ROUTINELY PERFORMED DUE TO PREDICTABILITY OF AMP/PEN/VAN SUSCEPTIBILITY. CULTURE REINCUBATED FOR BETTER GROWTH Performed at [facility], [address] [place], [place] [address]    Report Status PENDING  Incomplete  Aerobic/Anaerobic Culture w Gram Stain (surgical/deep wound)     Status: None (Preliminary result)   Collection Time: 04/16/21  9:08 PM   Specimen: PATH Other; Wound  Result Value Ref Range Status   Specimen Description   Final    WOUND LEFT HAND CX DEEP2 Performed at [facility], [address] [place], [place] [address]    Special Requests   Final    NONE Performed at [facility], [address] [place], [place] [address]    Gram Stain   Final    NO SQUAMOUS EPITHELIAL CELLS SEEN MODERATE WBC SEEN MODERATE GRAM POSITIVE COCCI    Culture   Final    MODERATE GROUP B  STREP(S.AGALACTIAE)ISOLATED TESTING AGAINST S. AGALACTIAE NOT ROUTINELY PERFORMED DUE TO PREDICTABILITY OF AMP/PEN/VAN SUSCEPTIBILITY. CULTURE REINCUBATED FOR BETTER GROWTH Performed at [facility], [address], [place], [place] [address]    Report Status PENDING  Incomplete  Aerobic/Anaerobic Culture w Gram Stain (surgical/deep wound)     Status: None (Preliminary result)   Collection Time: 04/16/21  9:12 PM   Specimen: PATH Other; Wound  Result Value  Ref Range Status   Specimen Description   Final    WOUND LEFT HAND CX DEEP3 Performed at [facility], [address] [place], [place] [address]    Special Requests   Final    NONE Performed at [facility], [address] [place], [place] [address]    Gram Stain   Final    NO SQUAMOUS EPITHELIAL CELLS SEEN FEW WBC SEEN MODERATE GRAM POSITIVE COCCI    Culture   Final    MODERATE GROUP B STREP(S.AGALACTIAE)ISOLATED TESTING AGAINST S. AGALACTIAE NOT ROUTINELY PERFORMED DUE TO PREDICTABILITY OF AMP/PEN/VAN SUSCEPTIBILITY. CULTURE REINCUBATED FOR BETTER GROWTH Performed at [facility], [address], [place], [place] [address]    Report Status PENDING  Incomplete     Labs: BNP (last 3 results) No results for input(s): BNP in the last 8760 hours. Basic Metabolic Panel: Recent Labs  Lab 04/16/21 0742 04/17/21 0528  NA 133* 137  K 4.0 3.8  CL 97* 106  CO2 26 22  GLUCOSE 430* 215*  BUN 12 13  CREATININE 0.78 0.77  CALCIUM 8.7* 8.2*   Liver Function Tests: No results for input(s): AST, ALT, ALKPHOS, BILITOT, PROT, ALBUMIN in the last 168 hours. No results for input(s): LIPASE, AMYLASE in the last 168 hours. No results for input(s): AMMONIA in the last 168 hours. CBC: Recent Labs  Lab 04/16/21 0742 04/17/21 0528  WBC 22.8* 23.5*  NEUTROABS 20.5*  --   HGB 14.0 12.1*  HCT 40.7 35.5*  MCV 91.5 90.6  PLT 428* 395   Cardiac Enzymes: No results for input(s): CKTOTAL, CKMB,  CKMBINDEX, TROPONINI in the last 168 hours. BNP: Invalid input(s): POCBNP CBG: Recent Labs  Lab 04/17/21 0819 04/17/21 1134 04/17/21 1609 04/17/21 2121 04/18/21 0745  GLUCAP 200* 222* 264* 264* 266*   D-Dimer No results for input(s): DDIMER in the last 72 hours. Hgb A1c Recent Labs    04/16/21 1604  HGBA1C 12.5*   Lipid Profile No results for input(s): CHOL, HDL, LDLCALC, TRIG, CHOLHDL, LDLDIRECT in the last 72 hours. Thyroid function studies No results for input(s): TSH, T4TOTAL, T3FREE, THYROIDAB in the last 72 hours.  Invalid input(s): FREET3 Anemia work up No results for input(s): VITAMINB12, FOLATE, FERRITIN, TIBC, IRON, RETICCTPCT in the last 72 hours. Urinalysis No results found for: COLORURINE, APPEARANCEUR, LABSPEC, PHURINE, GLUCOSEU, HGBUR, BILIRUBINUR, KETONESUR, PROTEINUR, UROBILINOGEN, NITRITE, LEUKOCYTESUR Sepsis Labs Invalid input(s): PROCALCITONIN,  WBC,  LACTICIDVEN Microbiology Recent Results (from the past 240 hour(s))  Culture, blood (routine x 2)     Status: None (Preliminary result)   Collection Time: 04/16/21  7:42 AM   Specimen: BLOOD  Result Value Ref Range Status   Specimen Description BLOOD BLOOD RIGHT FOREARM  Final   Special Requests   Final    BOTTLES DRAWN AEROBIC AND ANAEROBIC Blood Culture results may not be optimal due to an excessive volume of blood received in culture bottles   Culture   Final    NO GROWTH 2 DAYS Performed at [facility], [address], [place], [place] [address]    Report Status PENDING  Incomplete  Resp Panel by RT-PCR (Flu A&B, Covid) Nasopharyngeal Swab     Status: None   Collection Time: 04/16/21  7:42 AM   Specimen: Nasopharyngeal Swab; Nasopharyngeal(NP) swabs in vial transport medium  Result Value Ref Range Status   SARS Coronavirus 2 by RT PCR NEGATIVE NEGATIVE Final    Comment: (NOTE) SARS-CoV-2 target nucleic acids are NOT DETECTED.  The SARS-CoV-2 RNA is generally detectable in upper  respiratory specimens during the acute phase of infection. The lowest concentration of SARS-CoV-2 viral copies this assay can detect is 138 copies/mL. A negative result does not preclude SARS-Cov-2 infection and should not be used as the sole basis for treatment or other patient management decisions. A negative result may occur with  improper specimen collection/handling, submission of specimen other than nasopharyngeal swab, presence of viral mutation(s) within the areas targeted by this assay, and inadequate number of viral copies(<138 copies/mL). A negative result must be combined with clinical observations, patient history, and epidemiological information. The expected result is Negative.  Fact Sheet for Patients:  BloggerCourse.comhttps://www.fda.gov/media/152166/download  Fact Sheet for Healthcare Providers:  SeriousBroker.ithttps://www.fda.gov/media/152162/download  This test is no t yet approved or cleared by the [place] FDA and  has been authorized for detection and/or diagnosis of SARS-CoV-2 by FDA under an Emergency Use Authorization (EUA). This EUA will remain  in effect (meaning this test can be used) for the duration of the COVID-19 declaration under Section 564(b)(1) of the Act, 21 U.S.C.section 360bbb-3(b)(1), unless the authorization is terminated  or revoked sooner.       Influenza A by PCR NEGATIVE NEGATIVE Final   Influenza B by PCR NEGATIVE NEGATIVE Final    Comment: (NOTE) The Xpert Xpress SARS-CoV-2/FLU/RSV plus assay is intended as an aid in the diagnosis of influenza from Nasopharyngeal swab specimens and should not be used as a sole basis for treatment. Nasal washings and aspirates are unacceptable for Xpert Xpress SARS-CoV-2/FLU/RSV testing.  Fact Sheet for Patients: BloggerCourse.comhttps://www.fda.gov/media/152166/download  Fact Sheet for Healthcare Providers: SeriousBroker.ithttps://www.fda.gov/media/152162/download  This test is not yet approved or cleared by the [place] FDA and has been  authorized for detection and/or diagnosis of SARS-CoV-2 by FDA under an Emergency Use Authorization (EUA). This EUA will remain in effect (meaning this test can be used) for the duration of the COVID-19 declaration under Section 564(b)(1) of the Act, 21 U.S.C. section 360bbb-3(b)(1), unless the authorization is terminated or revoked.  Performed at [facility], [address] [place], [place] [address]   Culture, blood (routine x 2)     Status: None (Preliminary result)   Collection Time: 04/16/21  9:28 AM   Specimen: BLOOD  Result Value Ref Range Status   Specimen Description BLOOD BLOOD RIGHT FOREARM  Final   Special Requests   Final    BOTTLES DRAWN AEROBIC AND ANAEROBIC Blood Culture results may not be optimal due to an excessive volume of blood received in culture bottles   Culture   Final    NO GROWTH 2 DAYS Performed at [facility], [address] [place], [place] [address]    Report Status PENDING  Incomplete  MRSA Next Gen by PCR, Nasal     Status: None   Collection Time: 04/16/21  4:21 PM   Specimen: Nasal Mucosa; Nasal Swab  Result Value Ref Range Status   MRSA by PCR Next Gen NOT DETECTED NOT DETECTED Final    Comment: (NOTE) The GeneXpert MRSA Assay (FDA approved for NASAL specimens only), is one component of a comprehensive MRSA colonization surveillance program. It is not intended to diagnose MRSA infection nor to guide or monitor treatment for MRSA infections. Test performance is not FDA approved in patients less than 63 years old. Performed at [facility], [address] [place], [place] [address]   Aerobic/Anaerobic Culture w Gram Stain (surgical/deep wound)     Status: None (Preliminary result)  Collection Time: 04/16/21  9:07 PM   Specimen: PATH Other; Wound  Result Value Ref Range Status   Specimen Description   Final    TISSUE LEFT HAND CX DEEP Performed at [facility], [address], [place], [place]  [address]    Special Requests   Final    NONE Performed at [facility], [address] [place], [place] [address]    Gram Stain   Final    NO SQUAMOUS EPITHELIAL CELLS SEEN FEW WBC SEEN FEW GRAM POSITIVE COCCI    Culture   Final    MODERATE GROUP B STREP(S.AGALACTIAE)ISOLATED TESTING AGAINST S. AGALACTIAE NOT ROUTINELY PERFORMED DUE TO PREDICTABILITY OF AMP/PEN/VAN SUSCEPTIBILITY. CULTURE REINCUBATED FOR BETTER GROWTH Performed at [facility], [address] [place], [place] [address]    Report Status PENDING  Incomplete  Aerobic/Anaerobic Culture w Gram Stain (surgical/deep wound)     Status: None (Preliminary result)   Collection Time: 04/16/21  9:08 PM   Specimen: PATH Other; Wound  Result Value Ref Range Status   Specimen Description   Final    HAND LEFT HAND CX DEEP1 Performed at [facility], [address], [place], [place] [address]    Special Requests   Final    NONE Performed at [facility], [address] [place], [place] [address]    Gram Stain   Final    NO SQUAMOUS EPITHELIAL CELLS SEEN MODERATE WBC SEEN MODERATE GRAM POSITIVE COCCI    Culture   Final    MODERATE GROUP B STREP(S.AGALACTIAE)ISOLATED TESTING AGAINST S. AGALACTIAE NOT ROUTINELY PERFORMED DUE TO PREDICTABILITY OF AMP/PEN/VAN SUSCEPTIBILITY. CULTURE REINCUBATED FOR BETTER GROWTH Performed at [facility], [address], [place], [place] [address]    Report Status PENDING  Incomplete  Aerobic/Anaerobic Culture w Gram Stain (surgical/deep wound)     Status: None (Preliminary result)   Collection Time: 04/16/21  9:08 PM   Specimen: PATH Other; Wound  Result Value Ref Range Status   Specimen Description   Final    WOUND LEFT HAND CX DEEP2 Performed at [facility], [address] [place], [place] [address]    Special Requests   Final    NONE Performed at [facility], [address] [place], [place] [address]    Gram Stain    Final    NO SQUAMOUS EPITHELIAL CELLS SEEN MODERATE WBC SEEN MODERATE GRAM POSITIVE COCCI    Culture   Final    MODERATE GROUP B STREP(S.AGALACTIAE)ISOLATED TESTING AGAINST S. AGALACTIAE NOT ROUTINELY PERFORMED DUE TO PREDICTABILITY OF AMP/PEN/VAN SUSCEPTIBILITY. CULTURE REINCUBATED FOR BETTER GROWTH Performed at [facility], [address] [place], [place] [address]    Report Status PENDING  Incomplete  Aerobic/Anaerobic Culture w Gram Stain (surgical/deep wound)     Status: None (Preliminary result)   Collection Time: 04/16/21  9:12 PM   Specimen: PATH Other; Wound  Result Value Ref Range Status   Specimen Description   Final    WOUND LEFT HAND CX DEEP3 Performed at [facility], [address], [place], [place] [address]    Special Requests   Final    NONE Performed at [facility], [address] [place], [place] [address]    Gram Stain   Final    NO SQUAMOUS EPITHELIAL CELLS SEEN FEW WBC SEEN MODERATE GRAM POSITIVE COCCI    Culture   Final    MODERATE GROUP B STREP(S.AGALACTIAE)ISOLATED TESTING AGAINST S.  AGALACTIAE NOT ROUTINELY PERFORMED DUE TO PREDICTABILITY OF AMP/PEN/VAN SUSCEPTIBILITY. CULTURE REINCUBATED FOR BETTER GROWTH Performed at [facility], [address] [place], [place] [address]    Report Status PENDING  Incomplete     Time coordinating discharge: Over 30 minutes  SIGNED:   Tresa Moore, MD  [facility] 04/18/2021, 10:14 AM Pager   If 7PM-7AM, please contact night-coverage

## 2021-04-18 NOTE — Plan of Care (Signed)
?  Problem: Education: Goal: Knowledge of General Education information will improve Description: Including pain rating scale, medication(s)/side effects and non-pharmacologic comfort measures Outcome: Completed/Met   Problem: Health Behavior/Discharge Planning: Goal: Ability to manage health-related needs will improve Outcome: Completed/Met   Problem: Clinical Measurements: Goal: Ability to maintain clinical measurements within normal limits will improve Outcome: Completed/Met   

## 2021-04-18 NOTE — Anesthesia Postprocedure Evaluation (Signed)
Anesthesia Post Note ? ?Patient: Benjamin Brandt ? ?Procedure(s) Performed: IRRIGATION AND DEBRIDEMENT HAND (Left) ? ?Patient location during evaluation: PACU ?Anesthesia Type: Regional ?Level of consciousness: awake and alert ?Pain management: pain level controlled ?Vital Signs Assessment: post-procedure vital signs reviewed and stable ?Respiratory status: spontaneous breathing, nonlabored ventilation, respiratory function stable and patient connected to nasal cannula oxygen ?Cardiovascular status: blood pressure returned to baseline and stable ?Postop Assessment: no apparent nausea or vomiting ?Anesthetic complications: no ? ? ?No notable events documented. ? ? ?Last Vitals:  ?Vitals:  ? 04/17/21 1945 04/18/21 0744  ?BP: 110/61 115/66  ?Pulse: 74 70  ?Resp:  16  ?Temp: 37.4 ?C 37.2 ?C  ?SpO2: 97% 97%  ?  ?Last Pain:  ?Vitals:  ? 04/17/21 2227  ?TempSrc:   ?PainSc: 0-No pain  ? ? ?  ?  ?  ?  ?  ?  ? ?Corinda Gubler ? ? ? ? ?

## 2021-04-19 ENCOUNTER — Other Ambulatory Visit: Payer: Self-pay

## 2021-04-21 LAB — CULTURE, BLOOD (ROUTINE X 2)
Culture: NO GROWTH
Culture: NO GROWTH

## 2021-04-22 LAB — AEROBIC/ANAEROBIC CULTURE W GRAM STAIN (SURGICAL/DEEP WOUND)
Gram Stain: NONE SEEN
Gram Stain: NONE SEEN
Gram Stain: NONE SEEN
Gram Stain: NONE SEEN

## 2021-05-09 ENCOUNTER — Ambulatory Visit: Payer: Self-pay | Admitting: Pharmacy Technician

## 2021-05-09 DIAGNOSIS — Z79899 Other long term (current) drug therapy: Secondary | ICD-10-CM

## 2021-05-10 NOTE — Progress Notes (Signed)
Patient speaks Spanish.  Interpretation provided by [id number]. ? ?Completed [facility] application and contract.  Patient agreed to all terms of the [facility] contract.   ? ?Patient approved to receive medication assistance at [facility] until time for re-certification in 0301, and as long as eligibility criteria continues to be met.   ? ?Provided patient with community resource material based on his particular needs.   ?  ?Jacquelynn Cree ?Care Manager ?[facility] ?

## 2021-05-12 ENCOUNTER — Ambulatory Visit (INDEPENDENT_AMBULATORY_CARE_PROVIDER_SITE_OTHER): Payer: Self-pay | Admitting: Plastic Surgery

## 2021-05-12 VITALS — BP 111/64 | HR 74 | Ht 65.0 in | Wt 134.0 lb

## 2021-05-12 DIAGNOSIS — Z87828 Personal history of other (healed) physical injury and trauma: Secondary | ICD-10-CM

## 2021-05-12 NOTE — Progress Notes (Signed)
? ?Referring Provider ?[name], PA ?[address] ?[place],  [place] [address]  ? ?CC: No chief complaint on file. ?   ? ?Benjamin Brandt is an 61 y.o. male.  ?HPI: Patient presents as a referral for consideration for soft tissue coverage of a left hand wound.  He was admitted to the hospital about 3 weeks ago for left hand infection.  He was operated on by Dr. Franco Collet who performed an incision and drainage of his left hand for a deep hypothenar abscess.  He was monitored in the hospital for a few days afterwards and ultimately discharged.  He has not followed up with his original Careers adviser.  He is continuing to take antibiotics.  There was some concern over need for soft tissue coverage and I have been asked to weigh in in that regard. ? ?Not on File ? ?Outpatient Encounter Medications as of 05/12/2021  ?Medication Sig  ? acetaminophen (TYLENOL) 325 MG tablet Take 2 tablets (650 mg total) by mouth every 6 (six) hours as needed for mild pain (or Fever >/= 101).  ? Insulin Glargine (BASAGLAR KWIKPEN) 100 UNIT/ML Inject 10 Units into the skin once daily.  ? Insulin Pen Needle 32G X 4 MM MISC Use as directed.  ? ?No facility-administered encounter medications on file as of 05/12/2021.  ?  ? ?Past Medical History:  ?Diagnosis Date  ? Diabetes mellitus without complication (HCC)   ? ? ?Past Surgical History:  ?Procedure Laterality Date  ? I & D EXTREMITY Left 04/16/2021  ? Procedure: IRRIGATION AND DEBRIDEMENT HAND;  Surgeon: Karleen Hampshire, MD;  Location: [facility] ORS;  Service: Orthopedics;  Laterality: Left;  ? TOE AMPUTATION Right   ? ? ?No family history on file. ? ?Social History  ? ?Social History Narrative  ? Not on file  ?  ? ?Review of Systems ?General: Denies fevers, chills, weight loss ?CV: Denies chest pain, shortness of breath, palpitations ? ?Physical Exam ? ?  04/18/2021  ?  7:44 AM 04/17/2021  ?  7:45 PM 04/17/2021  ?  3:22 PM  ?Vitals with BMI  ?Systolic 115 110 111  ?Diastolic 66 61 61  ?Pulse 70  74 75  ?  ?General:  No acute distress,  Alert and oriented, Non-Toxic, Normal speech and affect ?Left hand: Fingers well-perfused normal cap refill palp radial pulse.  Sensation is intact throughout.  He has limited flexion of all fingers due to pain and swelling.  He has an open wound along the hypothenar eminence with sutures in place.  There is a few centimeters of eschar around the periphery of the wound.  There is mild erythema tracking proximally on the volar ulnar aspect of the wrist with small amount of serous drainage in that location.  I do not detect any purulence.  There is healthy granulation tissue in the center of his hypothenar wound covering several centimeters. ? ?Assessment/Plan ?Patient presents with left hand wound and what looks like some mild persistent cellulitis.  He is on antibiotics.  Given that he has not had any follow-up with his original surgeon I encouraged him to seek that out.  He is not a candidate for soft tissue coverage at this point as he looks to have some persistent cellulitis that needs to be resolved before considering soft tissue coverage.  I do think the wound in his hypothenar eminence would heal secondarily without need for skin graft.  I am happy to see him back to reevaluate his  candidacy for soft tissue coverage down the line if needed.  I discussed all this with the patient through the interpreter phone.  All of his questions were answered.  In the interim he will continue to do wound care until he is able to get in with his surgeon. ? ?Allena Napoleon ?05/12/2021, 10:54 AM  ? ? ?  ?

## 2021-05-13 ENCOUNTER — Other Ambulatory Visit: Payer: Self-pay

## 2021-06-09 ENCOUNTER — Other Ambulatory Visit: Payer: Self-pay

## 2022-09-26 ENCOUNTER — Other Ambulatory Visit: Payer: Self-pay

## 2022-10-02 IMAGING — CT CT HAND*L* W/CM
3 of 5 series · 11 of 33 positions shown, 13 images · IV contrast (agent unspecified)
Comparison: Radiographs 04/16/2021

CLINICAL DATA: Soft tissue infection

EXAM:
CT OF THE UPPER LEFT EXTREMITY WITH CONTRAST
TECHNIQUE: Multidetector CT imaging of the left hand was performed according to
the standard protocol following intravenous contrast administration.

[Series 3: extremity soft tissue · axial · 0.70mm/px · z∈[-692,-486]mm · 5 of 151 slices shown, 7 images]
[im 24/151  soft-tissue]
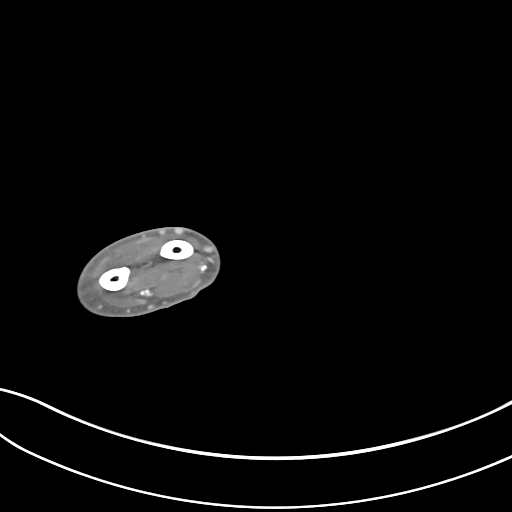
[im 24/151  bone]
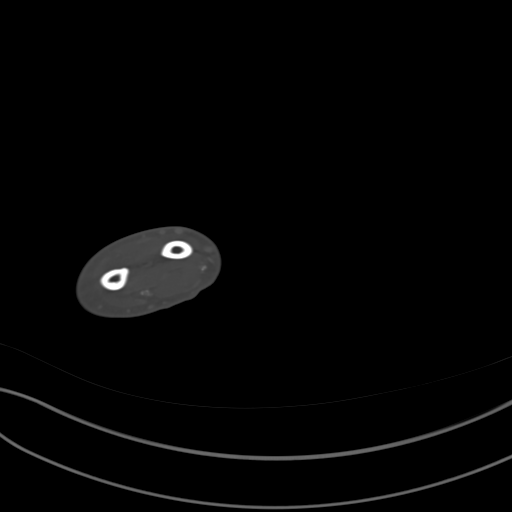
[im 47/151  bone]
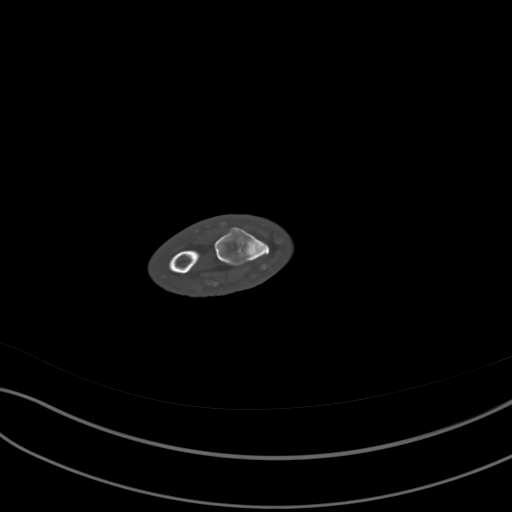
[im 81/151  bone]
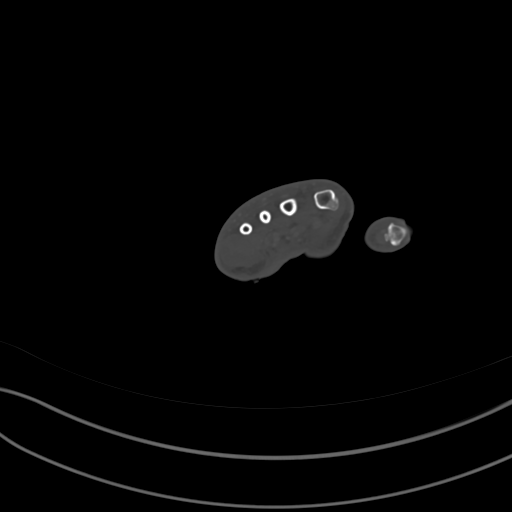
[im 104/151  bone]
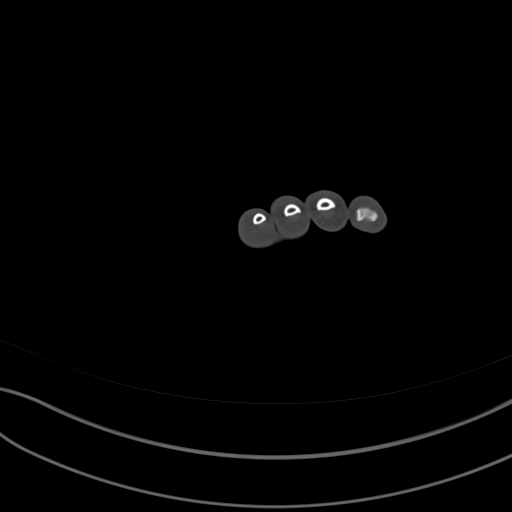
[im 127/151  soft-tissue]
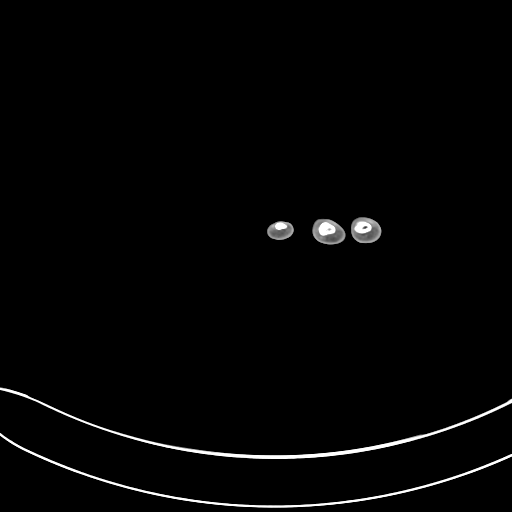
[im 127/151  bone]
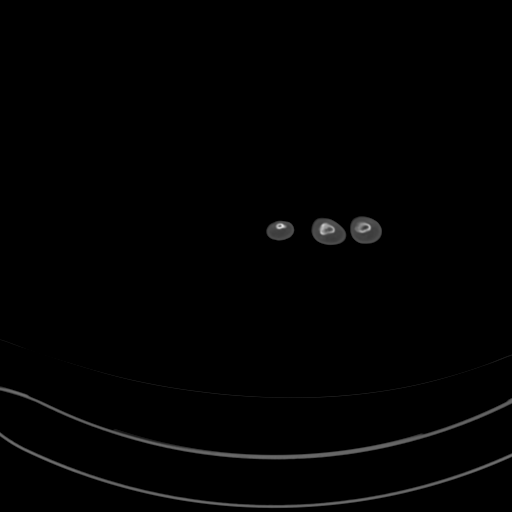

[Series 6: cor bone · coronal · 0.59mm/px · 1 of 56 slices shown]
[im 28/56  bone]
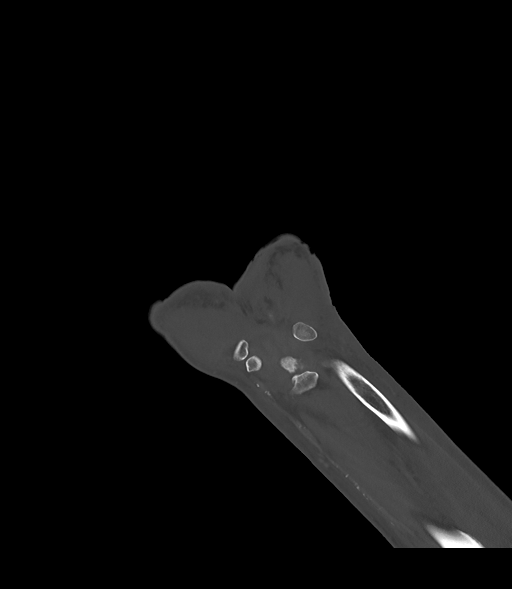

[Series 7: sag bone · sagittal · 0.31mm/px · 5 of 95 slices shown]
[im 16/95  bone]
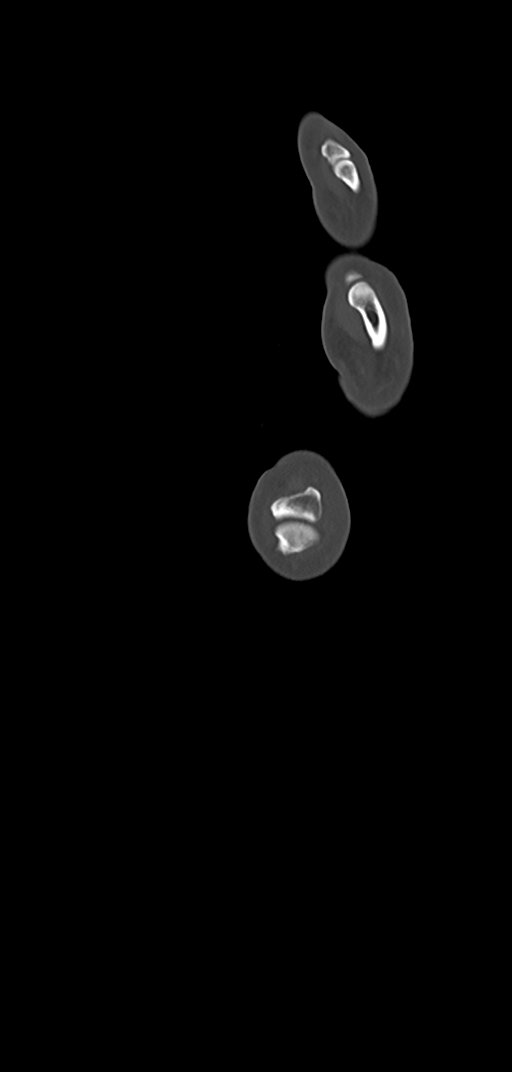
[im 32/95  bone]
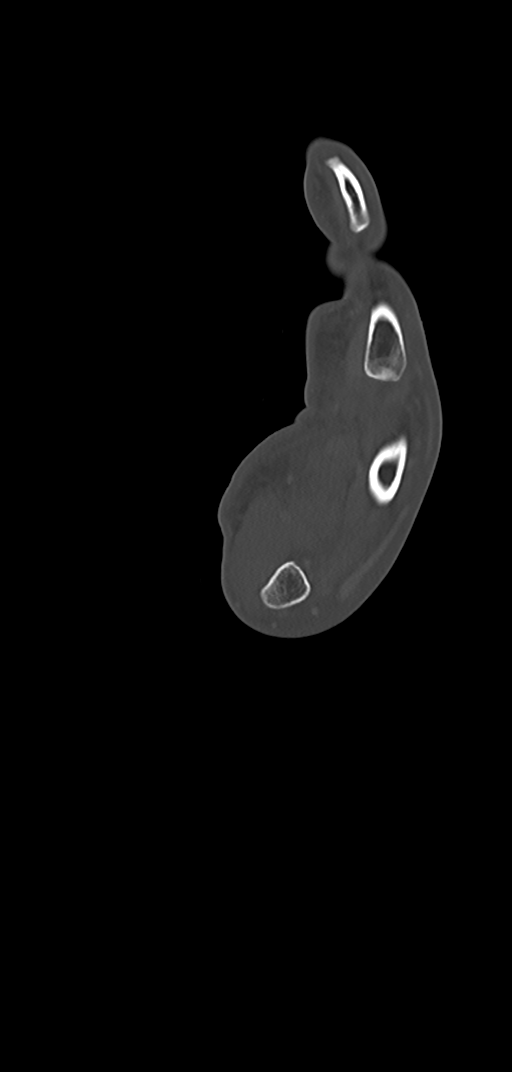
[im 48/95  bone]
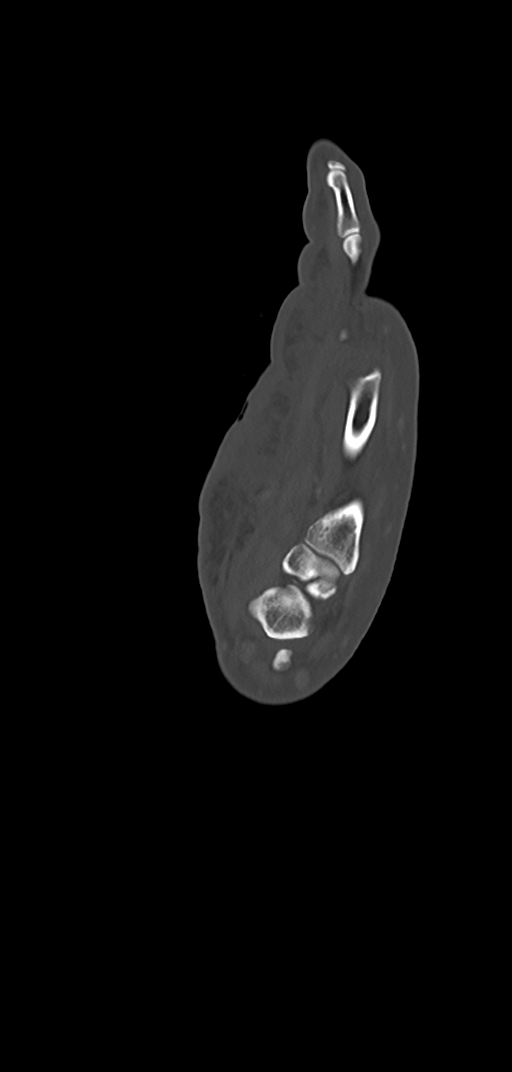
[im 63/95  bone]
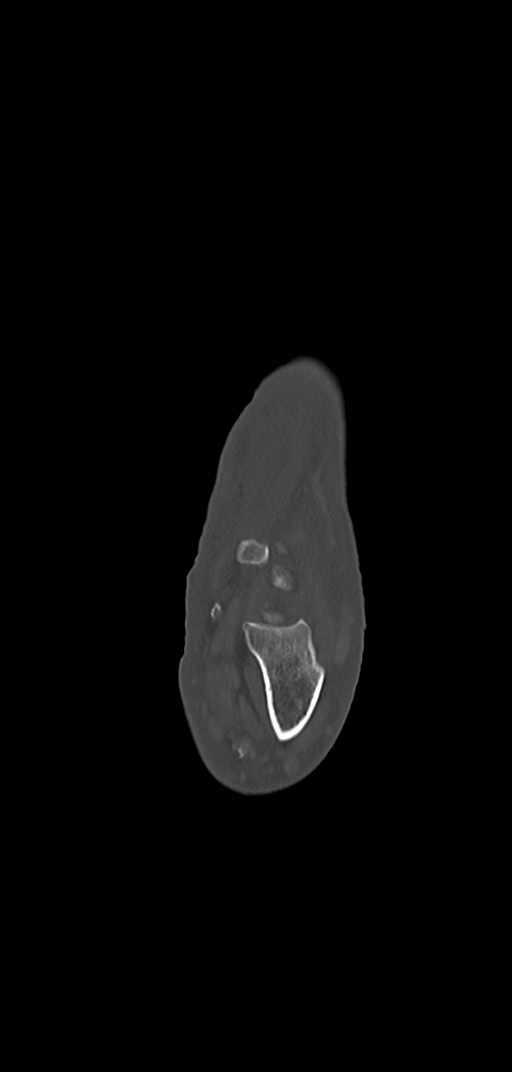
[im 79/95  bone]
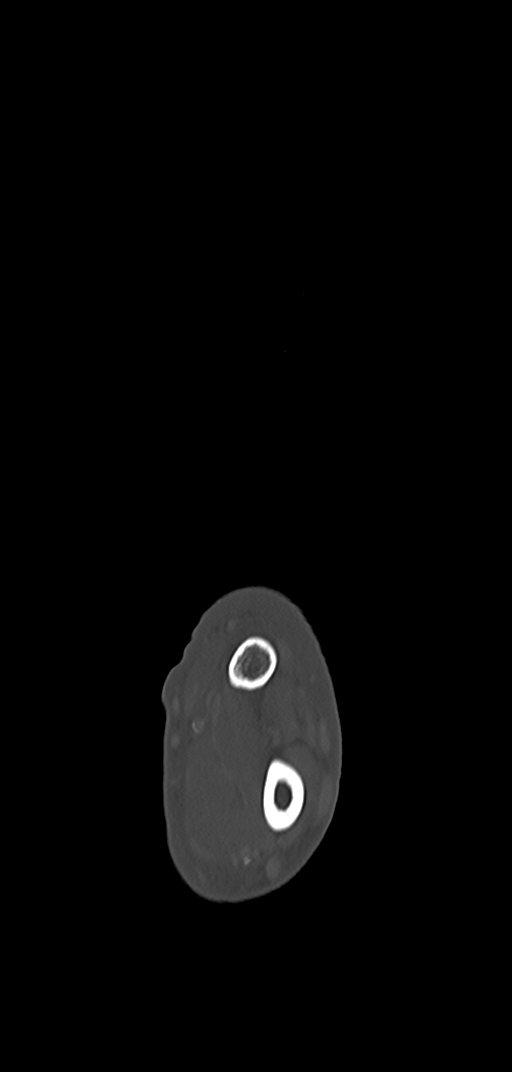

[11 of 33 positions shown; findings below may reference images not displayed]

RADIATION DOSE REDUCTION: This exam was performed according to the
departmental dose-optimization program which includes automated
exposure control, adjustment of the mA and/or kV according to
patient size and/or use of iterative reconstruction technique.

CONTRAST:  75mL OMNIPAQUE IOHEXOL 300 MG/ML  SOLN
FINDINGS: Please note, I did not have the technologist reperformed all axial,
sagittal, and coronal MPR is to match protocol because today's exam
is primarily to assess for infection rather than to assess for
fracture or specific bony abnormality.

Bones/Joint/Cartilage

Limited assessment due to imaging planes. No bony destructive
findings characteristic of osteomyelitis. Degenerative subcortical
cyst formation in the proximal lunate. Questionable lucent endosteal
scalloping anteriorly in the distal radius, significance uncertain.

Ligaments

Suboptimally assessed by CT.

Muscles and Tendons

No intramuscular abscess identified. However, there is a 2.6 by
by 4.4 cm (volume = 7.8 cm^3) fluid collection tracking along the
superficial fascia margin of the abductor digiti minimi muscle for
example on image 68 series 3. There is no gas within this
collection, and no substantial enhancement along the margins of this
fluid collection.

Soft tissues

Diffuse subcutaneous edema in the distal forearm, wrist, and
tracking into the dorsum of the hand. No gas tracks in the soft
tissues. Arterial atherosclerosis noted. Do not see a foreign body.
IMPRESSION: 1. Diffuse edema in the distal forearm, wrist, tracking in the
dorsum of the hand towards the fingers. No findings of
osteomyelitis, foreign body, or gas in the soft tissues.
2. Proximally 8 cc fluid collection tracks along the superficial
fascial margin of the abductor digiti minimi muscle. There is no
substantial enhancement of the margins of this collection to
indicate that it is an abscess in this may simply represent a
reactive but noninfected fluid collection. Incipient abscess cannot
be completely excluded.
